# Patient Record
Sex: Female | Born: 1980 | Race: Black or African American | Hispanic: No | Marital: Single | Smoking: Former smoker
Health system: Southern US, Community
[De-identification: ages and names within clinical notes are randomized; demographics above are authoritative.]

## PROBLEM LIST (undated history)

## (undated) HISTORY — PX: CHOLECYSTECTOMY: SHX55

---

## 2002-08-03 ENCOUNTER — Emergency Department (HOSPITAL_COMMUNITY): Admission: EM | Admit: 2002-08-03 | Discharge: 2002-08-03 | Payer: Self-pay | Admitting: Emergency Medicine

## 2005-09-09 ENCOUNTER — Emergency Department (HOSPITAL_COMMUNITY): Admission: EM | Admit: 2005-09-09 | Discharge: 2005-09-10 | Payer: Self-pay | Admitting: Emergency Medicine

## 2005-10-04 ENCOUNTER — Ambulatory Visit: Payer: Self-pay | Admitting: Family Medicine

## 2005-11-14 ENCOUNTER — Encounter (INDEPENDENT_AMBULATORY_CARE_PROVIDER_SITE_OTHER): Payer: Self-pay | Admitting: *Deleted

## 2005-11-14 ENCOUNTER — Ambulatory Visit (HOSPITAL_COMMUNITY): Admission: RE | Admit: 2005-11-14 | Discharge: 2005-11-15 | Payer: Self-pay | Admitting: Surgery

## 2007-12-22 ENCOUNTER — Emergency Department (HOSPITAL_COMMUNITY): Admission: EM | Admit: 2007-12-22 | Discharge: 2007-12-22 | Payer: Self-pay | Admitting: Emergency Medicine

## 2007-12-23 ENCOUNTER — Emergency Department (HOSPITAL_COMMUNITY): Admission: EM | Admit: 2007-12-23 | Discharge: 2007-12-23 | Payer: Self-pay | Admitting: Emergency Medicine

## 2008-02-14 ENCOUNTER — Emergency Department (HOSPITAL_COMMUNITY): Admission: EM | Admit: 2008-02-14 | Discharge: 2008-02-14 | Payer: Self-pay | Admitting: Emergency Medicine

## 2008-03-09 ENCOUNTER — Ambulatory Visit (HOSPITAL_COMMUNITY): Admission: RE | Admit: 2008-03-09 | Discharge: 2008-03-09 | Payer: Self-pay | Admitting: Obstetrics & Gynecology

## 2008-04-04 ENCOUNTER — Ambulatory Visit (HOSPITAL_COMMUNITY): Admission: RE | Admit: 2008-04-04 | Discharge: 2008-04-04 | Payer: Self-pay | Admitting: Obstetrics & Gynecology

## 2008-04-06 ENCOUNTER — Ambulatory Visit: Payer: Self-pay | Admitting: Obstetrics & Gynecology

## 2008-04-06 ENCOUNTER — Inpatient Hospital Stay (HOSPITAL_COMMUNITY): Admission: AD | Admit: 2008-04-06 | Discharge: 2008-04-06 | Payer: Self-pay | Admitting: Family Medicine

## 2008-04-08 ENCOUNTER — Ambulatory Visit: Payer: Self-pay | Admitting: Physician Assistant

## 2008-04-08 ENCOUNTER — Inpatient Hospital Stay (HOSPITAL_COMMUNITY): Admission: AD | Admit: 2008-04-08 | Discharge: 2008-04-08 | Payer: Self-pay | Admitting: Obstetrics and Gynecology

## 2008-06-06 ENCOUNTER — Ambulatory Visit (HOSPITAL_COMMUNITY): Admission: RE | Admit: 2008-06-06 | Discharge: 2008-06-06 | Payer: Self-pay | Admitting: Family Medicine

## 2008-07-04 ENCOUNTER — Ambulatory Visit (HOSPITAL_COMMUNITY): Admission: RE | Admit: 2008-07-04 | Discharge: 2008-07-04 | Payer: Self-pay | Admitting: Obstetrics & Gynecology

## 2008-07-22 ENCOUNTER — Ambulatory Visit (HOSPITAL_COMMUNITY): Admission: RE | Admit: 2008-07-22 | Discharge: 2008-07-22 | Payer: Self-pay | Admitting: Obstetrics & Gynecology

## 2008-07-27 ENCOUNTER — Inpatient Hospital Stay (HOSPITAL_COMMUNITY): Admission: AD | Admit: 2008-07-27 | Discharge: 2008-07-28 | Payer: Self-pay | Admitting: Obstetrics & Gynecology

## 2008-07-29 ENCOUNTER — Inpatient Hospital Stay (HOSPITAL_COMMUNITY): Admission: AD | Admit: 2008-07-29 | Discharge: 2008-07-29 | Payer: Self-pay | Admitting: Obstetrics & Gynecology

## 2008-07-29 ENCOUNTER — Ambulatory Visit: Payer: Self-pay | Admitting: Obstetrics & Gynecology

## 2008-07-31 ENCOUNTER — Inpatient Hospital Stay (HOSPITAL_COMMUNITY): Admission: AD | Admit: 2008-07-31 | Discharge: 2008-08-02 | Payer: Self-pay | Admitting: Obstetrics & Gynecology

## 2008-07-31 ENCOUNTER — Ambulatory Visit: Payer: Self-pay | Admitting: Obstetrics and Gynecology

## 2008-07-31 ENCOUNTER — Encounter: Payer: Self-pay | Admitting: Obstetrics & Gynecology

## 2008-08-09 ENCOUNTER — Ambulatory Visit: Payer: Self-pay | Admitting: Family Medicine

## 2008-08-09 ENCOUNTER — Inpatient Hospital Stay (HOSPITAL_COMMUNITY): Admission: AD | Admit: 2008-08-09 | Discharge: 2008-08-09 | Payer: Self-pay | Admitting: Gynecology

## 2008-08-12 ENCOUNTER — Inpatient Hospital Stay (HOSPITAL_COMMUNITY): Admission: AD | Admit: 2008-08-12 | Discharge: 2008-08-12 | Payer: Self-pay | Admitting: Obstetrics & Gynecology

## 2010-01-18 ENCOUNTER — Emergency Department (HOSPITAL_COMMUNITY): Admission: EM | Admit: 2010-01-18 | Discharge: 2010-01-18 | Payer: Self-pay | Admitting: Emergency Medicine

## 2010-03-13 IMAGING — US US OB LIMITED
1 series · 13 of 13 positions shown · non-contrast
Comparison: 12/22/07.

CLINICAL DATA: Bleeding, cramping, pregnancy.  
 LIMITED OBSTETRICAL ULTRASOUND:

[Series 1: unknown · 0.28mm/px · 13 of 13 slices shown]
[im 1/13]
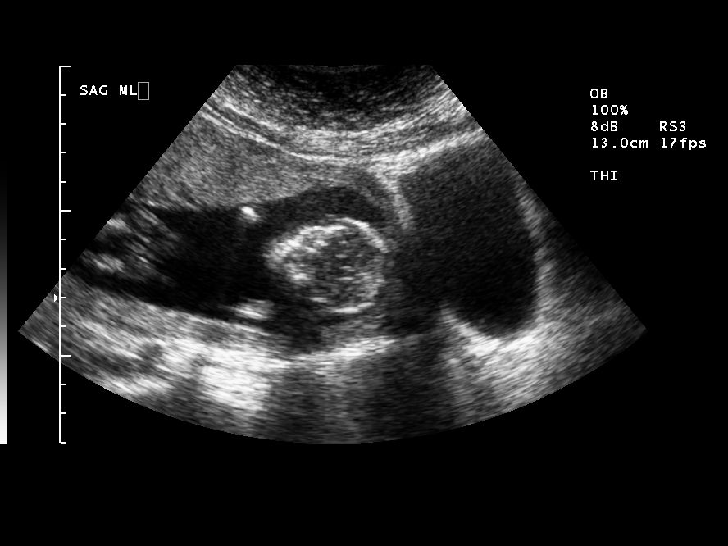
[im 2/13]
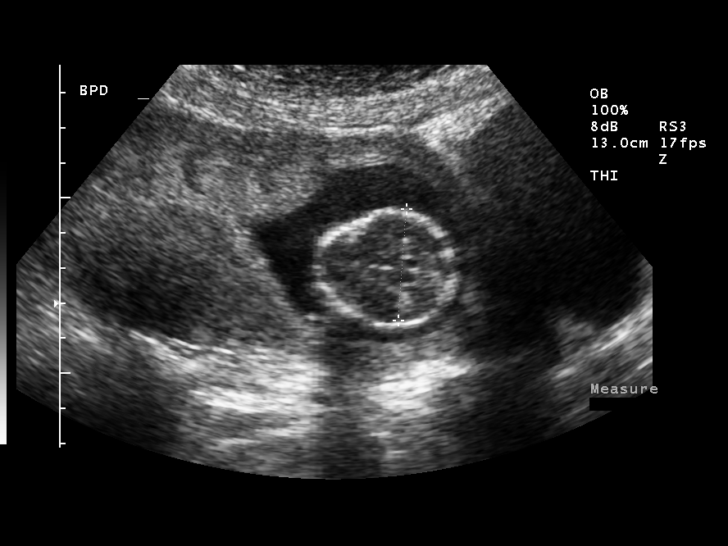
[im 3/13]
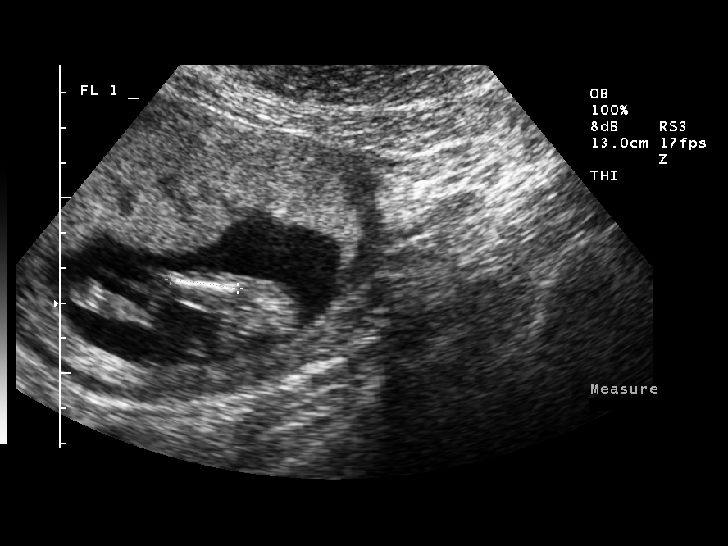
[im 4/13]
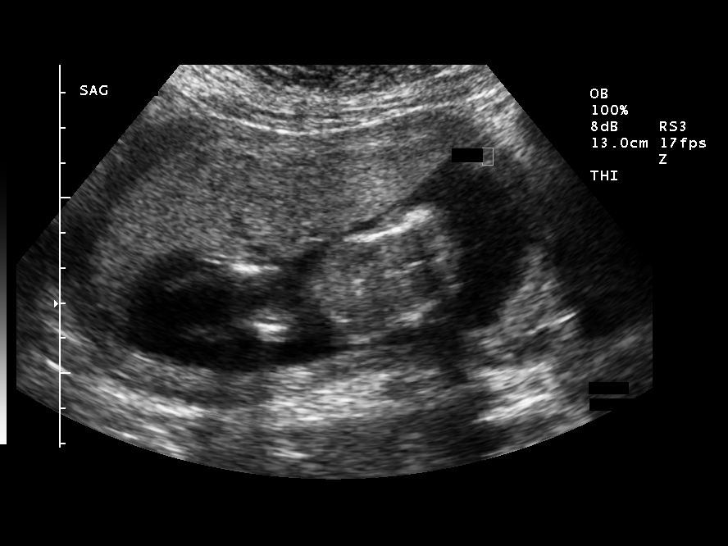
[im 5/13]
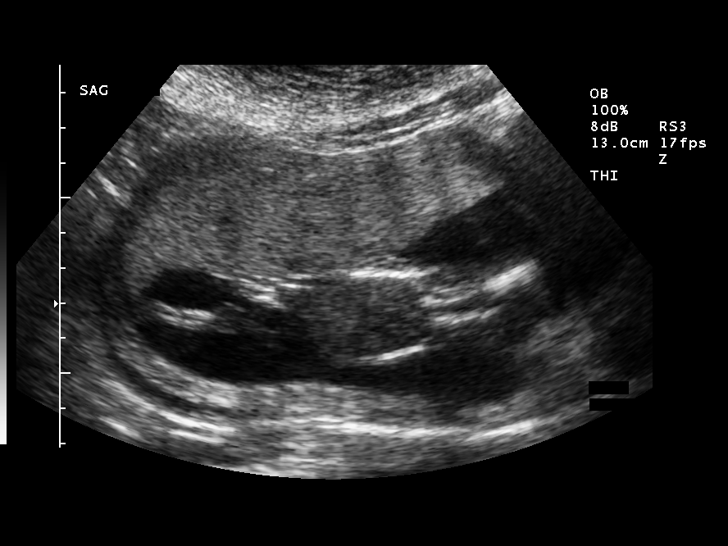
[im 6/13]
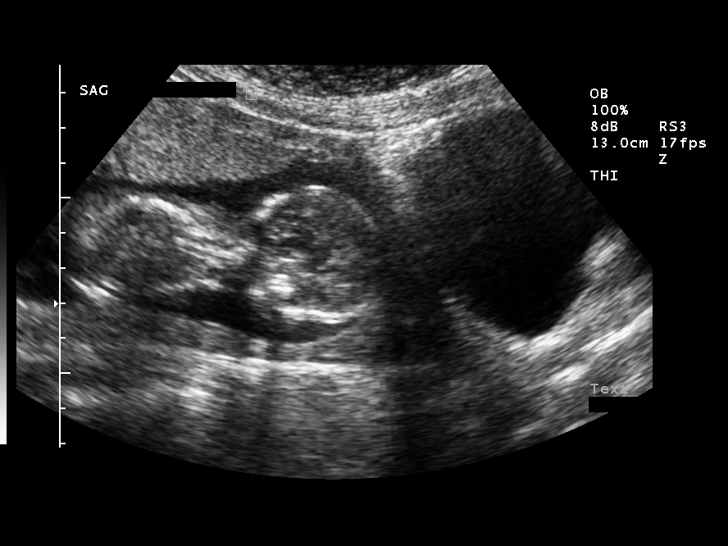
[im 7/13]
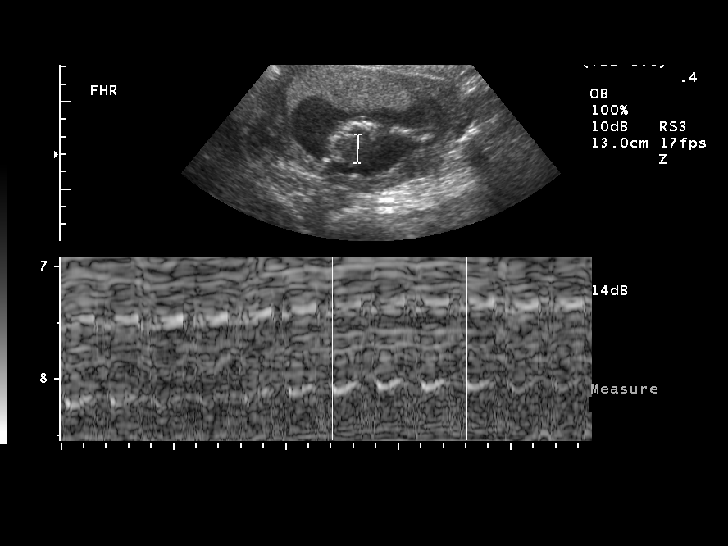
[im 8/13]
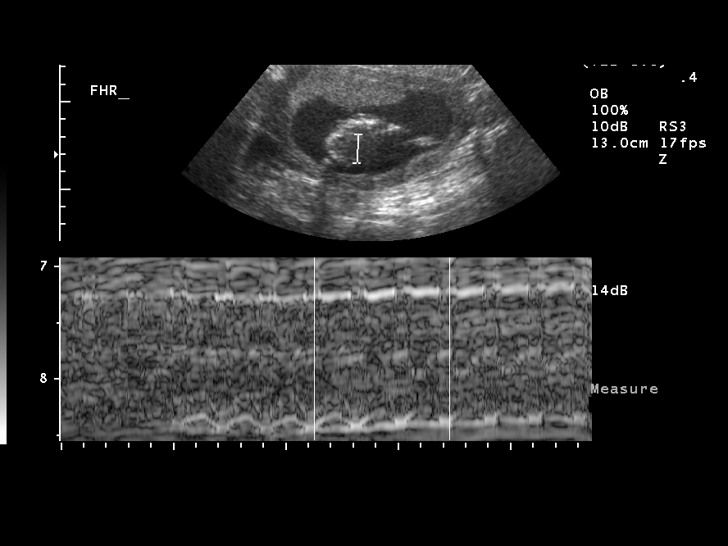
[im 9/13]
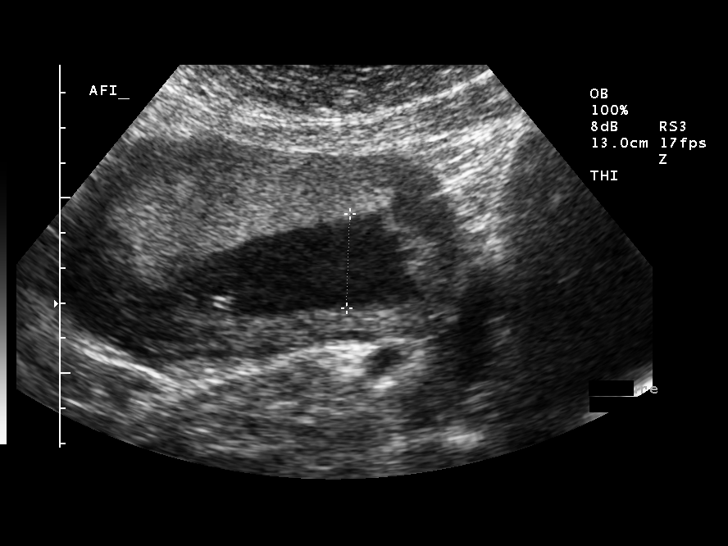
[im 10/13]
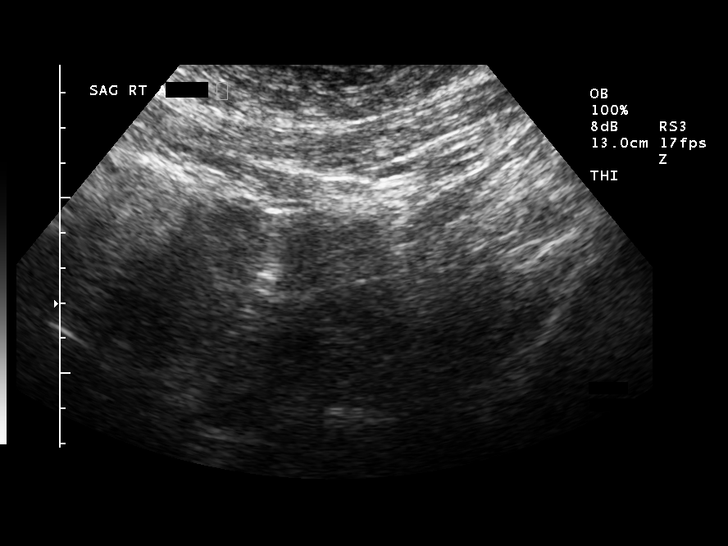
[im 11/13]
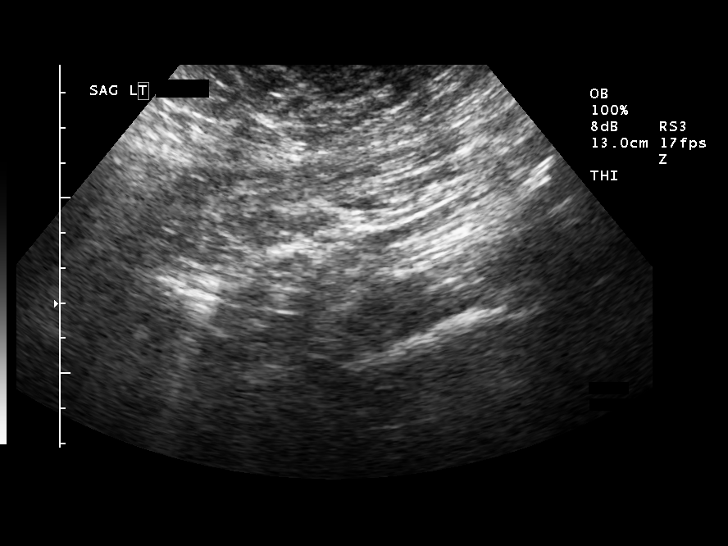
[im 12/13]
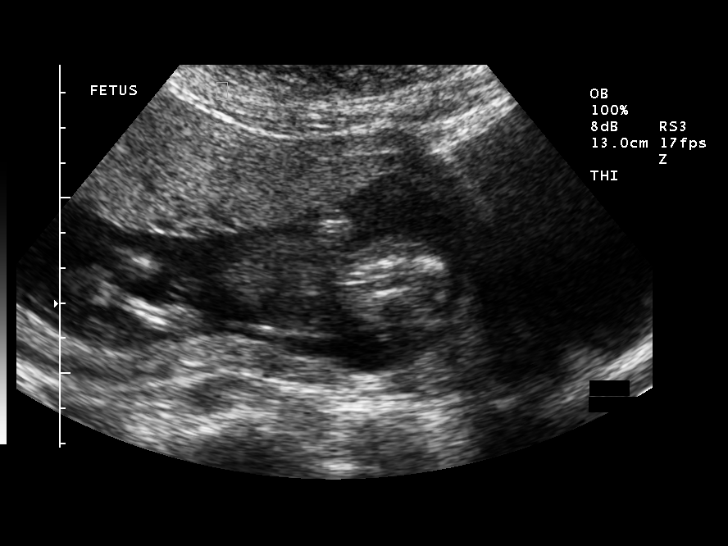
[im 13/13]
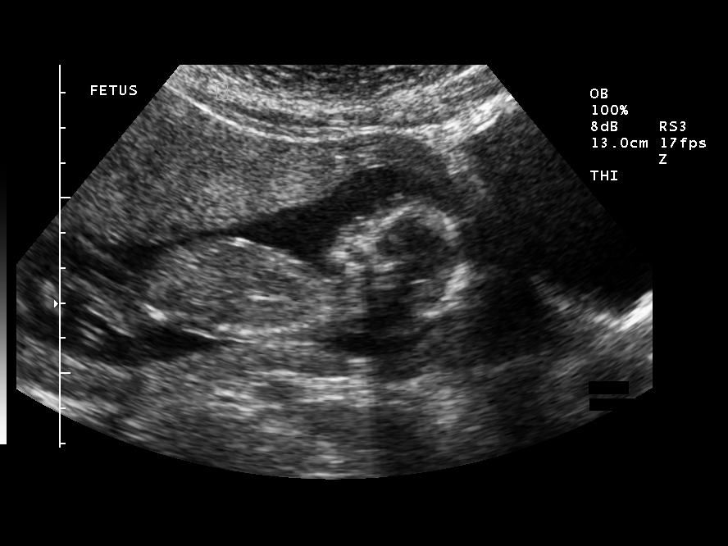

[13 of 13 positions shown; findings below may reference images not displayed]

FINDINGS: Single viable intrauterine pregnancy is identified.  Fetal heart rate is 150 bpm.  Fetal movement is present.  Presentation is cephalic.  Placental location is anterior.  No evidence of previa.  Amniotic fluid volume is normal.  Amniotic fluid index 13.2, with a 5th percentile measurement of 7.9 and a 95th percentile of 18.5.  Biparietal diameter is 3.2 cm, corresponding with 16 weeks 0 days.  Femur length is 1.9 cm, corresponding with 15 weeks 5 days.  Cervical os is closed.
IMPRESSION: Single viable intrauterine pregnancy with estimated gestational age of 16 weeks.

## 2010-09-04 ENCOUNTER — Emergency Department (HOSPITAL_COMMUNITY): Admission: EM | Admit: 2010-09-04 | Discharge: 2010-09-04 | Payer: Self-pay | Admitting: Emergency Medicine

## 2010-10-23 ENCOUNTER — Inpatient Hospital Stay (HOSPITAL_COMMUNITY)
Admission: AD | Admit: 2010-10-23 | Discharge: 2010-10-23 | Payer: Self-pay | Source: Home / Self Care | Admitting: Obstetrics & Gynecology

## 2010-12-05 ENCOUNTER — Ambulatory Visit (HOSPITAL_COMMUNITY)
Admission: RE | Admit: 2010-12-05 | Discharge: 2010-12-05 | Payer: Self-pay | Source: Home / Self Care | Attending: Family Medicine | Admitting: Family Medicine

## 2010-12-15 ENCOUNTER — Inpatient Hospital Stay (HOSPITAL_COMMUNITY)
Admission: AD | Admit: 2010-12-15 | Discharge: 2010-12-16 | Payer: Self-pay | Source: Home / Self Care | Attending: Obstetrics & Gynecology | Admitting: Obstetrics & Gynecology

## 2010-12-18 ENCOUNTER — Other Ambulatory Visit: Payer: Self-pay | Admitting: Family Medicine

## 2010-12-18 LAB — COMPREHENSIVE METABOLIC PANEL
ALT: 29 U/L (ref 0–35)
AST: 26 U/L (ref 0–37)
Albumin: 2.9 g/dL — ABNORMAL LOW (ref 3.5–5.2)
Alkaline Phosphatase: 119 U/L — ABNORMAL HIGH (ref 39–117)
BUN: 4 mg/dL — ABNORMAL LOW (ref 6–23)
CO2: 23 mEq/L (ref 19–32)
Calcium: 8.6 mg/dL (ref 8.4–10.5)
Chloride: 105 mEq/L (ref 96–112)
Creatinine, Ser: 0.5 mg/dL (ref 0.4–1.2)
GFR calc Af Amer: >60 mL/min (ref >60)
GFR calc non Af Amer: >60 mL/min (ref >60)
Glucose, Bld: 91 mg/dL (ref 70–99)
Potassium: 3.6 mEq/L (ref 3.5–5.1)
Sodium: 136 mEq/L (ref 135–145)
Total Bilirubin: 0.7 mg/dL (ref 0.3–1.2)
Total Protein: 6.8 g/dL (ref 6.0–8.3)

## 2010-12-18 LAB — GC/CHLAMYDIA PROBE AMP, GENITAL
Chlamydia, DNA Probe: NEGATIVE
GC Probe Amp, Genital: NEGATIVE

## 2010-12-18 LAB — INFLUENZA PANEL BY PCR (TYPE A & B)
H1N1 flu by pcr: NOT DETECTED
Influenza A By PCR: NEGATIVE
Influenza B By PCR: NEGATIVE

## 2010-12-18 LAB — DIFFERENTIAL
Basophils Absolute: 0 10*3/uL (ref 0.0–0.1)
Basophils Relative: 0 % (ref 0–1)
Eosinophils Absolute: 0 10*3/uL (ref 0.0–0.7)
Eosinophils Relative: 0 % (ref 0–5)
Lymphocytes Relative: 8 % — ABNORMAL LOW (ref 12–46)
Lymphs Abs: 1 10*3/uL (ref 0.7–4.0)
Monocytes Absolute: 0.8 10*3/uL (ref 0.1–1.0)
Monocytes Relative: 7 % (ref 3–12)
Neutro Abs: 10.5 10*3/uL — ABNORMAL HIGH (ref 1.7–7.7)
Neutrophils Relative %: 85 % — ABNORMAL HIGH (ref 43–77)

## 2010-12-18 LAB — CBC
HCT: 36.5 % (ref 36.0–46.0)
Hemoglobin: 12.4 g/dL (ref 12.0–15.0)
MCH: 25.6 pg — ABNORMAL LOW (ref 26.0–34.0)
MCHC: 34 g/dL (ref 30.0–36.0)
MCV: 75.4 fL — ABNORMAL LOW (ref 78.0–100.0)
Platelets: 141 10*3/uL — ABNORMAL LOW (ref 150–400)
RBC: 4.84 MIL/uL (ref 3.87–5.11)
RDW: 15.4 % (ref 11.5–15.5)
WBC: 12.3 10*3/uL — ABNORMAL HIGH (ref 4.0–10.5)

## 2010-12-18 LAB — FETAL FIBRONECTIN: Fetal Fibronectin: NEGATIVE

## 2010-12-18 LAB — WET PREP, GENITAL
Clue Cells Wet Prep HPF POC: NONE SEEN
Trich, Wet Prep: NONE SEEN
Yeast Wet Prep HPF POC: NONE SEEN

## 2011-01-01 ENCOUNTER — Ambulatory Visit (HOSPITAL_COMMUNITY)
Admission: RE | Admit: 2011-01-01 | Discharge: 2011-01-01 | Disposition: A | Discharge status: Home / Self Care | Payer: Medicaid Other | Source: Ambulatory Visit | Attending: Family Medicine | Admitting: Family Medicine

## 2011-01-01 ENCOUNTER — Encounter (HOSPITAL_COMMUNITY): Payer: Self-pay

## 2011-01-01 DIAGNOSIS — Z3689 Encounter for other specified antenatal screening: Secondary | ICD-10-CM | POA: Insufficient documentation

## 2011-01-01 DIAGNOSIS — O3660X0 Maternal care for excessive fetal growth, unspecified trimester, not applicable or unspecified: Secondary | ICD-10-CM | POA: Insufficient documentation

## 2011-01-24 ENCOUNTER — Inpatient Hospital Stay (HOSPITAL_COMMUNITY)
Admission: AD | Admit: 2011-01-24 | Discharge: 2011-01-26 | DRG: 775 | Disposition: A | Discharge status: Home / Self Care | Payer: Medicaid Other | Source: Ambulatory Visit | Attending: Obstetrics and Gynecology | Admitting: Obstetrics and Gynecology

## 2011-01-24 DIAGNOSIS — O411090 Infection of amniotic sac and membranes, unspecified, unspecified trimester, not applicable or unspecified: Secondary | ICD-10-CM | POA: Diagnosis present

## 2011-01-24 DIAGNOSIS — O99892 Other specified diseases and conditions complicating childbirth: Principal | ICD-10-CM | POA: Diagnosis present

## 2011-01-24 DIAGNOSIS — O9989 Other specified diseases and conditions complicating pregnancy, childbirth and the puerperium: Secondary | ICD-10-CM

## 2011-01-24 DIAGNOSIS — Z22330 Carrier of Group B streptococcus: Secondary | ICD-10-CM

## 2011-01-24 LAB — CBC
HCT: 35.4 % — ABNORMAL LOW (ref 36.0–46.0)
Hemoglobin: 11.7 g/dL — ABNORMAL LOW (ref 12.0–15.0)
MCH: 25.7 pg — ABNORMAL LOW (ref 26.0–34.0)
MCHC: 33.1 g/dL (ref 30.0–36.0)
MCV: 77.6 fL — ABNORMAL LOW (ref 78.0–100.0)
Platelets: 161 10*3/uL (ref 150–400)
RBC: 4.56 MIL/uL (ref 3.87–5.11)
RDW: 16.2 % — ABNORMAL HIGH (ref 11.5–15.5)
WBC: 12.3 10*3/uL — ABNORMAL HIGH (ref 4.0–10.5)

## 2011-01-24 LAB — RPR: RPR Ser Ql: NONREACTIVE

## 2011-01-28 LAB — RH IMMUNE GLOB WKUP(>/=20WKS)(NOT WOMEN'S HOSP)
Fetal Screen: NEGATIVE
Unit division: 0

## 2011-02-05 LAB — CBC
HCT: 33.7 % — ABNORMAL LOW (ref 36.0–46.0)
Hemoglobin: 11.2 g/dL — ABNORMAL LOW (ref 12.0–15.0)
MCH: 28.1 pg (ref 26.0–34.0)
MCHC: 33.2 g/dL (ref 30.0–36.0)
MCV: 84.7 fL (ref 78.0–100.0)
Platelets: 204 10*3/uL (ref 150–400)
RBC: 3.98 MIL/uL (ref 3.87–5.11)
RDW: 14.2 % (ref 11.5–15.5)
WBC: 12.7 10*3/uL — ABNORMAL HIGH (ref 4.0–10.5)

## 2011-02-05 LAB — URINALYSIS, ROUTINE W REFLEX MICROSCOPIC
Bilirubin Urine: NEGATIVE
Glucose, UA: 250 mg/dL — AB
Hgb urine dipstick: NEGATIVE
Ketones, ur: NEGATIVE mg/dL
Nitrite: NEGATIVE
Protein, ur: NEGATIVE mg/dL
Specific Gravity, Urine: 1.02 (ref 1.005–1.030)
Urobilinogen, UA: 0.2 mg/dL (ref 0.0–1.0)
pH: 6.5 (ref 5.0–8.0)

## 2011-02-05 LAB — DIFFERENTIAL
Basophils Absolute: 0 10*3/uL (ref 0.0–0.1)
Basophils Relative: 0 % (ref 0–1)
Eosinophils Absolute: 0 10*3/uL (ref 0.0–0.7)
Eosinophils Relative: 0 % (ref 0–5)
Monocytes Absolute: 0.9 10*3/uL (ref 0.1–1.0)
Neutro Abs: 9.2 10*3/uL — ABNORMAL HIGH (ref 1.7–7.7)

## 2011-02-05 LAB — RH IMMUNE GLOBULIN WORKUP (NOT WOMEN'S HOSP)
ABO/RH(D): B NEG
Antibody Screen: NEGATIVE
Unit division: 0

## 2011-02-07 LAB — URINALYSIS, ROUTINE W REFLEX MICROSCOPIC
Bilirubin Urine: NEGATIVE
Glucose, UA: 100 mg/dL — AB
Hgb urine dipstick: NEGATIVE
Protein, ur: NEGATIVE mg/dL
Urobilinogen, UA: 0.2 mg/dL (ref 0.0–1.0)

## 2011-04-12 NOTE — Op Note (Signed)
Toni Maldonado, Toni Maldonado                ACCOUNT NO.:  [id number]   MEDICAL RECORD NO.:  [id number]          PATIENT TYPE:  OIB   LOCATION:  5703                         FACILITY:  [facility]   PHYSICIAN:  Clovis Pu. Cornett, M.D.DATE OF BIRTH:  [birth year]   DATE OF PROCEDURE:  11/14/2005  DATE OF DISCHARGE:  11/15/2005                                 OPERATIVE REPORT   PREOPERATIVE DIAGNOSIS:  Symptomatic cholelithiasis.   POSTOPERATIVE DIAGNOSIS:  Symptomatic cholelithiasis.   PROCEDURE:  Laparoscopic cholecystectomy with intraoperative cholangiogram.   SURGEON:  Thomas A. Cornett, M.D.   ASSISTANT:  Sheppard Plumber. Earlene Plater, M.D.   ANESTHESIA:  General endotracheal esthesia with 10 mL of 0.5% Sensorcaine  local.   ESTIMATED BLOOD LOSS:  20 mL.   SPECIMEN:  Gallbladder to pathology.   INDICATIONS FOR PROCEDURE:  The patient is a 30 year old female with  progressive right upper quadrant pain. She was seen on an outpatient basis  and was scheduled for laparoscopic cholecystectomy.  Over the last week her  symptoms have become much worse and we have moved her surgery up for  symptomatic cholelithiasis. The procedure was explained to the patient as  well as the potential complications and outcome and she agreed to proceed  after giving informed consent.   DESCRIPTION OF PROCEDURE:  The patient was brought to the operating room and  placed supine. After induction of general endotracheal anesthesia, the  abdomen was prepped and draped in sterile fashion.  A 1 cm supraumbilical  incision was made and dissection was carried down to the fascia. The fascia  was grasped with a Kocher and a small incision was made in the fascia with a  scalpel. I then grasp both sides of the fascia and used a Kelly clamp to  push into the abdominal cavity bluntly. A pursestring suture of 0 Vicryl was  placed and a 12 mm Hassan cannula was placed under direct vision.  Pneumoperitoneum was created to 50 mmHg of CO2.  Laparoscopy was performed.  There was no evidence of any solid or hollow organ injury. Next, the patient  was placed in reverse Trendelenburg and rolled to her left.  General  inspection of the abdomen revealed no other significant abnormality. A 5 mm  port was then placed under direct vision. Two other 5 mL ports were placed  in the right mid abdomen. The gallbladder was identified, grasped by its  dome and retracted toward the patient's right shoulder. A second grasper was  used to grab the infundibulum and retract it toward the patient's right  lower quadrant. Cautery was used to score the peritoneum at the junction of  the cystic duct and cystic artery. Once this was done, we were able to  dissect out the cystic duct circumferentially as the only tubular structure  entering the gallbladder. Clip was placed on the gallbladder side of the  cystic duct and a small incision was made in the cystic duct. Through a  separate stab incision a Cook cholangiogram catheter was introduced and  placed in the cystic duct and held in place by a clip. Intraoperative  cholangiogram was performed using half-strength Hypaque dye. There was free  flow of contrast through the cystic duct up into the common hepatic duct to  the bifurcation of the right and left hepatic ducts. There was free flow of  contrast in the common bile duct into the duodenum. I did not see any  evidence of stone, obstruction or stricture at this point. The catheter was  then removed and the cholangiogram was complete. Three clips were placed in  the cystic duct. Cystic arteries identified coursing on the body of the  gallbladder and three clips were placed on this and this was divided.  After  this was done, the cautery was used to dissect the gallbladder from the  gallbladder fossa. All bleeding was controlled with electrocautery. Using a  5 mm scope an EndoCatch bag was placed and the gallbladder was placed in the  EndoCatch bag and  extracted through the umbilicus and passed off the field.  The gallbladder bed was examined and irrigation was used and suctioned out.  There was still some oozing from the gallbladder bed. I controlled this with  cautery. I did not see any further bleeding as this was done and there was  no evidence of any leakage of bile. Clips were on the cystic duct and cystic  arteries respectively. At this point, any excess irrigation was suctioned  out until clear. The ports were subsequently removed with no evidence of  port site bleeding. The camera was withdrawn from the umbilicus and there  was no evidence of any solid or hollow organ injury at this point. The  Hassan cannula was removed and passed off the field. CO2 was released. I  then close the umbilical port fascia with the previously placed pursestring  suture. The 4-0 Monocryl was used to close all skin incisions. All sponge,  instruments and needles were counted and found to be correct at this portion  of the case. The patient was then awoke and taken to recovery in  satisfactory condition.      Thomas A. Cornett, M.D.  Electronically Signed     TAC/MEDQ  D:  11/14/2005  T:  11/16/2005  Job:  [id number]   cc:   [facility]

## 2011-08-15 LAB — CBC
Hemoglobin: 12.3
MCHC: 33.5
RBC: 4.53
WBC: 12.4 — ABNORMAL HIGH

## 2011-08-15 LAB — DIFFERENTIAL
Basophils Relative: 0
Lymphocytes Relative: 21
Lymphs Abs: 2.6
Monocytes Absolute: 0.5
Monocytes Relative: 4
Neutro Abs: 9.3 — ABNORMAL HIGH
Neutrophils Relative %: 75

## 2011-08-15 LAB — URINALYSIS, ROUTINE W REFLEX MICROSCOPIC
Glucose, UA: NEGATIVE
Hgb urine dipstick: NEGATIVE
Specific Gravity, Urine: 1.016
Urobilinogen, UA: 0.2

## 2011-08-15 LAB — BASIC METABOLIC PANEL
CO2: 28
Calcium: 8.8
Creatinine, Ser: 0.65
GFR calc Af Amer: >60
Sodium: 137

## 2011-08-19 LAB — DIFFERENTIAL
Basophils Relative: 0
Eosinophils Absolute: 0
Eosinophils Relative: 0
Lymphs Abs: 2.2
Monocytes Absolute: 0.8
Monocytes Relative: 8
Neutrophils Relative %: 71

## 2011-08-19 LAB — CBC
HCT: 36.6
Hemoglobin: 12.2
MCHC: 33.4
MCV: 81.3
RBC: 4.5
WBC: 10.8 — ABNORMAL HIGH

## 2011-08-19 LAB — URINALYSIS, ROUTINE W REFLEX MICROSCOPIC
Bilirubin Urine: NEGATIVE
Glucose, UA: 100 — AB
Ketones, ur: NEGATIVE
Nitrite: NEGATIVE
Specific Gravity, Urine: 1.017
pH: 7

## 2011-08-19 LAB — RH IMMUNE GLOBULIN WORKUP (NOT WOMEN'S HOSP): Antibody Screen: NEGATIVE

## 2011-08-19 LAB — RPR: RPR Ser Ql: NONREACTIVE

## 2011-08-19 LAB — POCT PREGNANCY, URINE: Operator id: 29026

## 2011-08-19 LAB — WET PREP, GENITAL
Trich, Wet Prep: NONE SEEN
Yeast Wet Prep HPF POC: NONE SEEN

## 2011-08-19 LAB — ABO/RH: ABO/RH(D): B NEG

## 2011-08-21 LAB — WOUND CULTURE

## 2011-08-26 LAB — COMPREHENSIVE METABOLIC PANEL
ALT: 52 — ABNORMAL HIGH
Alkaline Phosphatase: 86
BUN: 10
CO2: 27
Chloride: 109
GFR calc non Af Amer: >60
Glucose, Bld: 98
Potassium: 3.4 — ABNORMAL LOW
Sodium: 142
Total Bilirubin: 0.6

## 2011-08-26 LAB — URIC ACID: Uric Acid, Serum: 7.2 — ABNORMAL HIGH

## 2011-08-26 LAB — CBC
HCT: 40
Hemoglobin: 12.9
RBC: 4.78
RDW: 15.9 — ABNORMAL HIGH
WBC: 8.6

## 2011-08-28 LAB — RH IMMUNE GLOB WKUP(>/=20WKS)(NOT WOMEN'S HOSP): Fetal Screen: NEGATIVE

## 2011-08-28 LAB — CBC
HCT: 39.3
Hemoglobin: 11.8 — ABNORMAL LOW
MCHC: 32.7
Platelets: 150
RBC: 4.36
RDW: 16.5 — ABNORMAL HIGH
RDW: 17.1 — ABNORMAL HIGH
WBC: 9.9

## 2011-08-28 LAB — RPR: RPR Ser Ql: NONREACTIVE

## 2013-01-01 IMAGING — US US OB FOLLOW-UP
1 series · 12 of 28 positions shown · non-contrast
Comparison: none

[Series 1: us ob follow up · 12 of 86 slices shown]
[im 4/86]
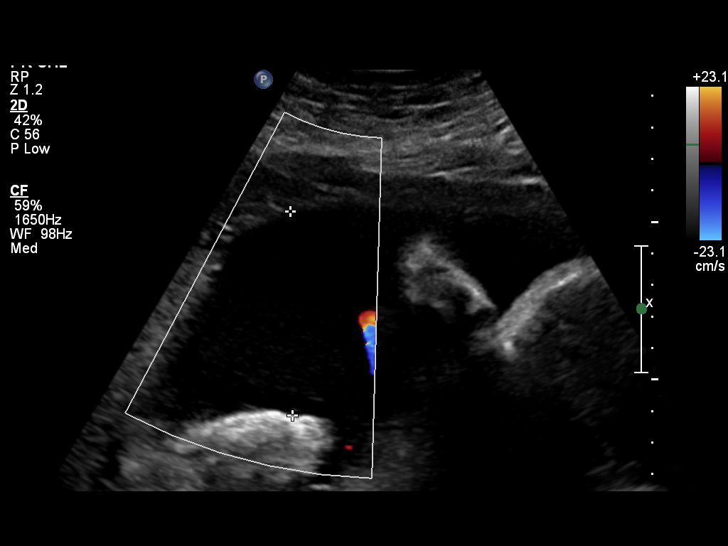
[im 10/86]
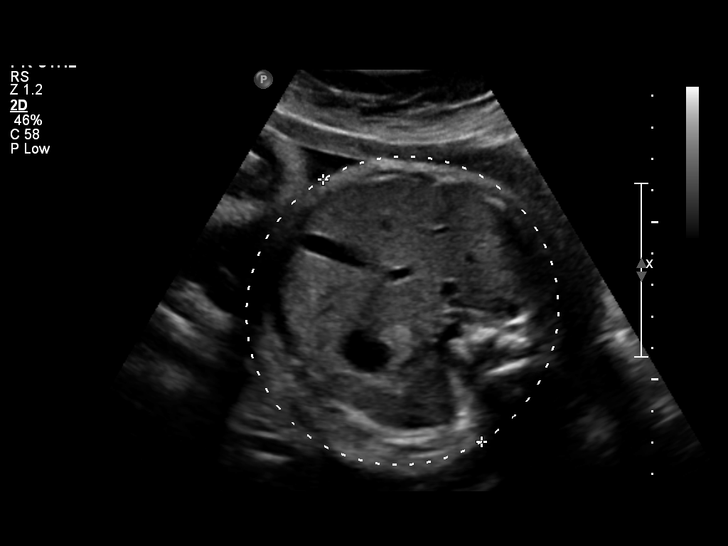
[im 16/86]
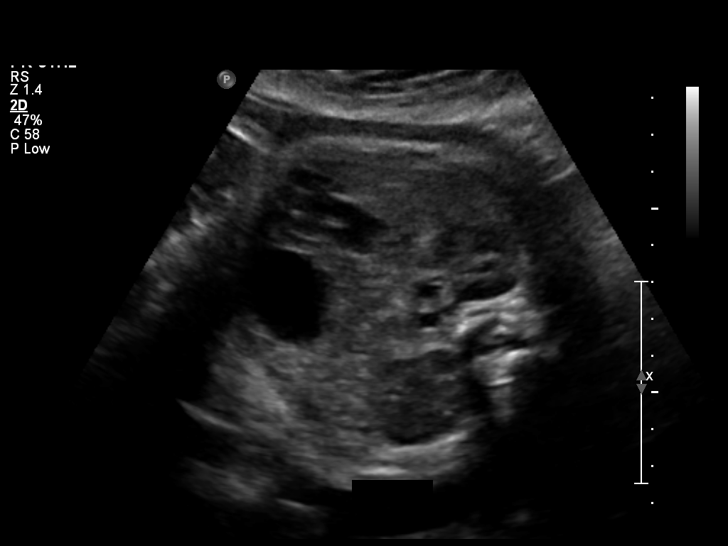
[im 26/86]
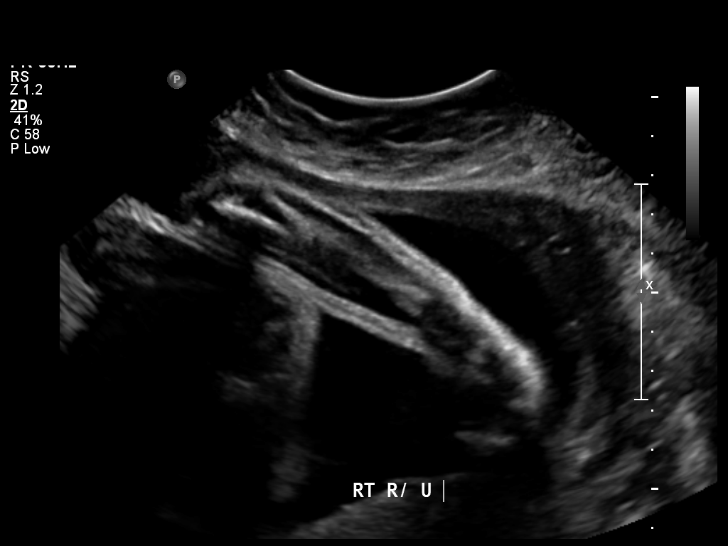
[im 32/86]
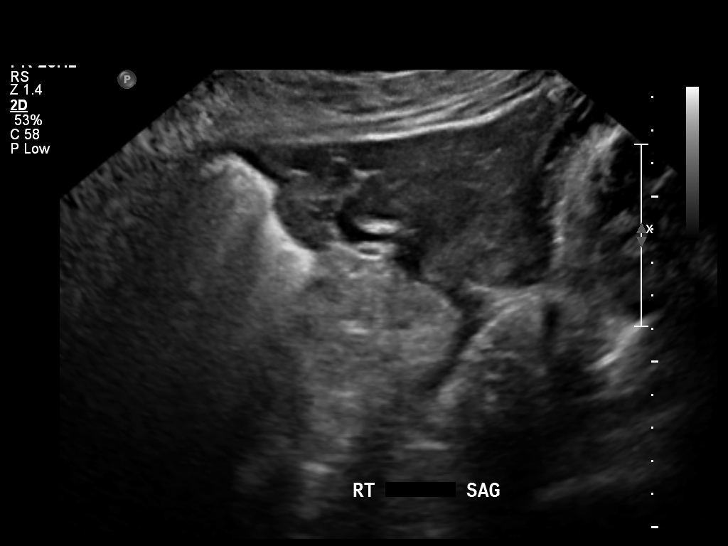
[im 38/86]
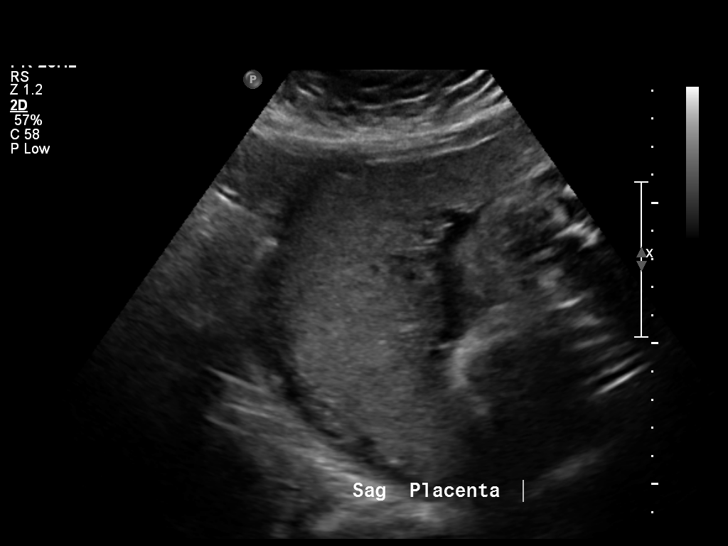
[im 48/86]
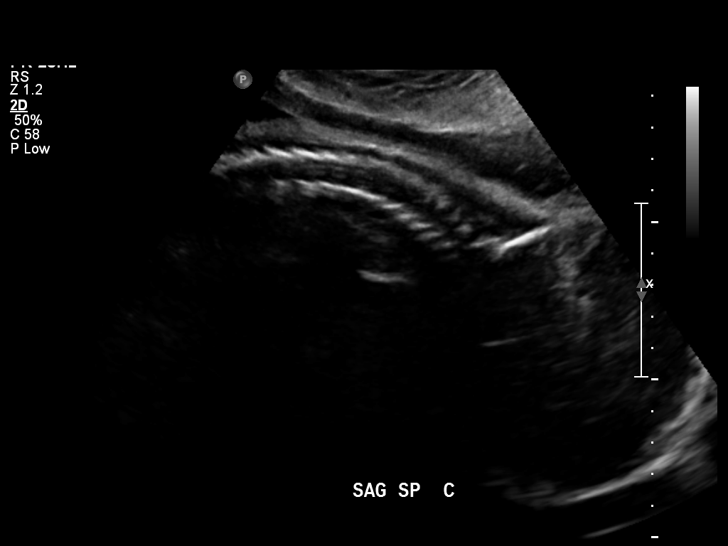
[im 54/86]
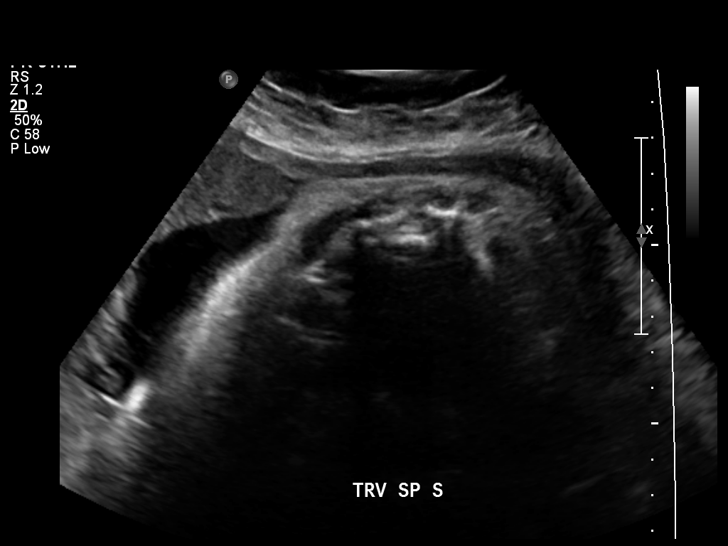
[im 60/86]
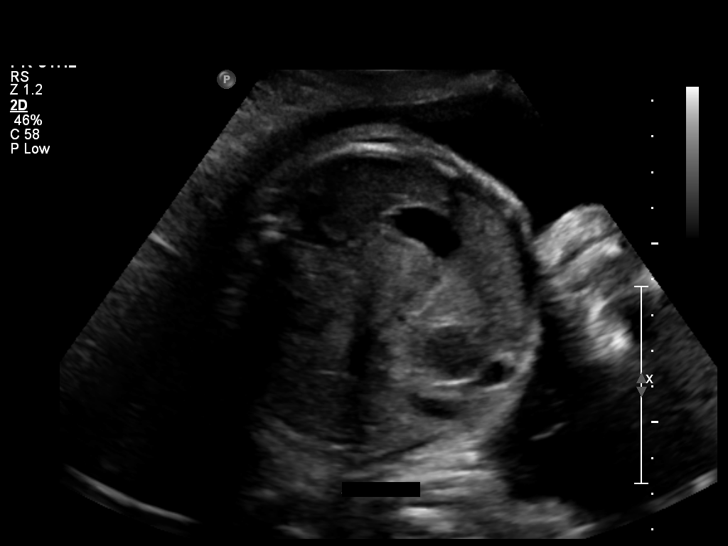
[im 70/86]
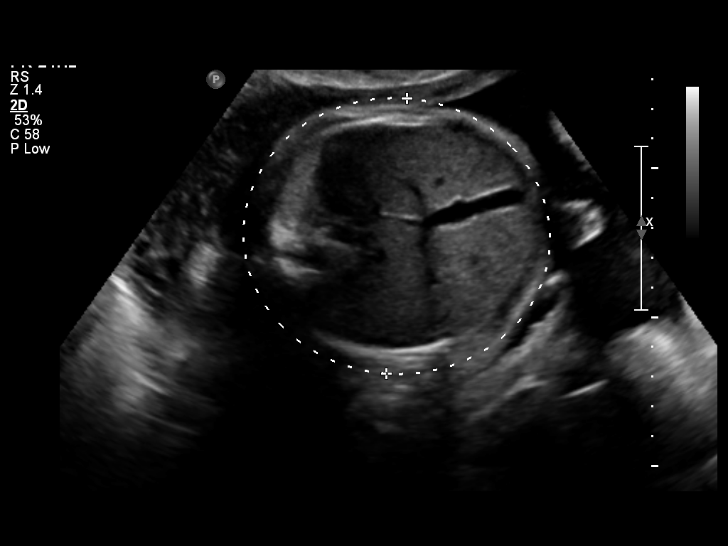
[im 76/86]
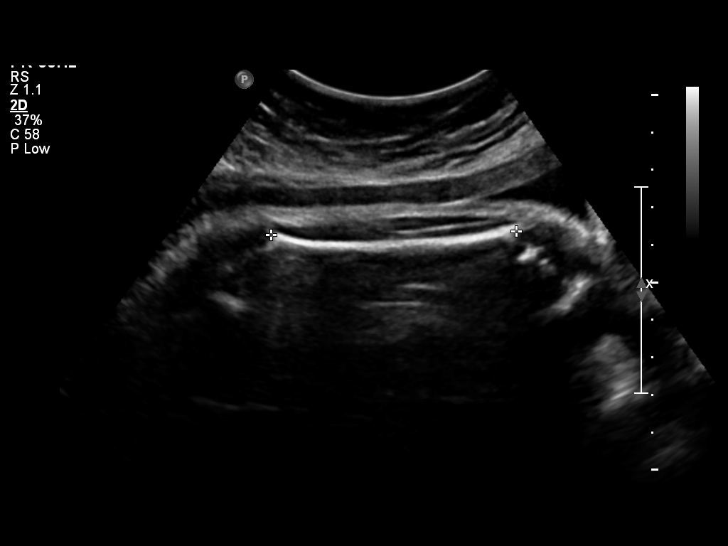
[im 82/86]
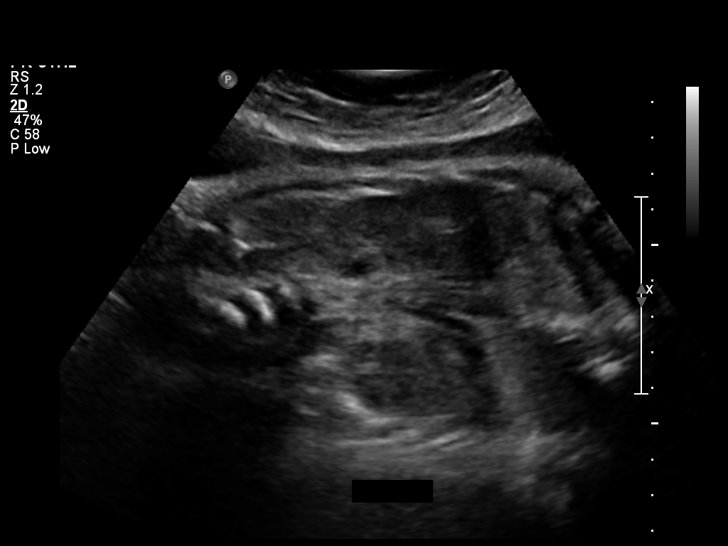

[12 of 28 positions shown; findings below may reference images not displayed]

OBSTETRICS REPORT
                    (Corrected Final 12/06/2010 [DATE])

                 [REDACTED]-
                 Faculty Physician
 Order#:         _O
Procedures

 US OB FOLLOW UP                                       76816.1
Indications

 No or Little Prenatal Care
 Size greater than dates (Large for gestational [AGE]
 Assess Fetal Growth / Estimated Fetal Weight
 Follow-up fetal anatomic evaluation
Fetal Evaluation

 Fetal Heart Rate:  143                          bpm
 Cardiac Activity:  Observed
 Presentation:      Cephalic
 Placenta:          Fundal, above cervical os
 P. Cord            Previously Visualized
 Insertion:

 Amniotic Fluid
 AFI FV:      Subjectively within normal limits
 AFI Sum:     15.15   cm       54  %Tile     Larg Pckt:    6.78  cm
 RUQ:   6.78    cm   RLQ:    4.56   cm    LUQ:   0.96    cm   LLQ:    2.85   cm
Biometry

 BPD:     86.4  mm     G. Age:  34w 6d                CI:        70.08   70 - 86
                                                      FL/HC:      20.4   19.1 -

 HC:     329.2  mm     G. Age:  37w 3d     > 97  %    HC/AC:      1.08   0.96 -

 AC:     304.2  mm     G. Age:  34w 3d       93  %    FL/BPD:     77.8   71 - 87
 FL:      67.2  mm     G. Age:  34w 4d       88  %    FL/AC:      22.1   20 - 24

 Est. FW:    6226  gm      5 lb 9 oz     88  %
Gestational Age

 LMP:           32w 3d        Date:  04/22/10                 EDD:   01/27/11
 U/S Today:     35w 2d                                        EDD:   01/07/11
 Best:          32w 3d     Det. By:  LMP  (04/22/10)          EDD:   01/27/11
Anatomy

 Cranium:           Appears normal      Aortic Arch:       Basic anatomy
                                                           exam per order
 Fetal Cavum:       Previously seen     Ductal Arch:       Basic anatomy
                                                           exam per order
 Ventricles:        Appears normal      Diaphragm:         Appears normal
 Choroid Plexus:    Appears normal      Stomach:           Appears
                                                           normal, left
                                                           sided
 Cerebellum:        Previously seen     Abdomen:           Appears normal
 Posterior Fossa:   Previously seen     Abdominal Wall:    Not well
                                                           visualized
 Nuchal Fold:       Not applicable      Cord Vessels:      Previously seen
                    (>20 wks GA)
 Face:              Lips and orbits     Kidneys:           Appear normal
                    previously seen
 Heart:             Appears normal      Bladder:           Appears normal
                    (4 chamber &
                    axis)
 RVOT:              Basic anatomy       Spine:             Appears normal
                    exam per order
 LVOT:              Basic anatomy       Limbs:             Four extremities
                    exam per order                         previously seen

 Other:     Fetus appears to be a female. RUE appears normal.
            Profile appears normal.
Cervix Uterus Adnexa

 Cervical Length:    3.1      cm

 Cervix:       Normal appearance by transabdominal scan.

 Left Ovary:    Not visualized.
 Right Ovary:   Within normal limits.
 Adnexa:     No abnormality visualized.
Impression

 Assigned GA is currently 33w 6d. EFW is currently at the
 88%. Continued close follow up for growth would be
 recommended to exclude a developing LGA fetus.
 Amniotic fluid within normal limits, with AFI of 15.15 cm.
 No late developing fetal abnormalities seen involving
 visualized anatomy.

 questions or concerns.

## 2013-10-12 ENCOUNTER — Encounter (HOSPITAL_COMMUNITY): Payer: Self-pay | Admitting: Emergency Medicine

## 2013-10-12 ENCOUNTER — Emergency Department (HOSPITAL_COMMUNITY): Payer: Medicaid Other

## 2013-10-12 ENCOUNTER — Emergency Department (HOSPITAL_COMMUNITY)
Admission: EM | Admit: 2013-10-12 | Discharge: 2013-10-12 | Disposition: A | Discharge status: Home / Self Care | Payer: Medicaid Other | Attending: Emergency Medicine | Admitting: Emergency Medicine

## 2013-10-12 DIAGNOSIS — J159 Unspecified bacterial pneumonia: Secondary | ICD-10-CM | POA: Insufficient documentation

## 2013-10-12 DIAGNOSIS — R509 Fever, unspecified: Secondary | ICD-10-CM | POA: Insufficient documentation

## 2013-10-12 DIAGNOSIS — R079 Chest pain, unspecified: Secondary | ICD-10-CM | POA: Insufficient documentation

## 2013-10-12 DIAGNOSIS — R109 Unspecified abdominal pain: Secondary | ICD-10-CM | POA: Insufficient documentation

## 2013-10-12 DIAGNOSIS — F17200 Nicotine dependence, unspecified, uncomplicated: Secondary | ICD-10-CM | POA: Insufficient documentation

## 2013-10-12 DIAGNOSIS — Z3202 Encounter for pregnancy test, result negative: Secondary | ICD-10-CM | POA: Insufficient documentation

## 2013-10-12 DIAGNOSIS — J189 Pneumonia, unspecified organism: Secondary | ICD-10-CM

## 2013-10-12 LAB — LIPASE, BLOOD: Lipase: 15 U/L (ref 11–59)

## 2013-10-12 LAB — CBC WITH DIFFERENTIAL/PLATELET
Basophils Absolute: 0 10*3/uL (ref 0.0–0.1)
Basophils Relative: 0 % (ref 0–1)
Eosinophils Absolute: 0 10*3/uL (ref 0.0–0.7)
HCT: 41 % (ref 36.0–46.0)
Hemoglobin: 14.1 g/dL (ref 12.0–15.0)
MCH: 27.7 pg (ref 26.0–34.0)
MCHC: 34.4 g/dL (ref 30.0–36.0)
Monocytes Absolute: 0.5 10*3/uL (ref 0.1–1.0)
Monocytes Relative: 4 % (ref 3–12)
RDW: 13.9 % (ref 11.5–15.5)

## 2013-10-12 LAB — URINALYSIS, ROUTINE W REFLEX MICROSCOPIC
Ketones, ur: 15 mg/dL — AB
Nitrite: NEGATIVE
Specific Gravity, Urine: 1.026 (ref 1.005–1.030)
pH: 7.5 (ref 5.0–8.0)

## 2013-10-12 LAB — COMPREHENSIVE METABOLIC PANEL
AST: 167 U/L — ABNORMAL HIGH (ref 0–37)
Albumin: 3.5 g/dL (ref 3.5–5.2)
BUN: 8 mg/dL (ref 6–23)
Calcium: 8.8 mg/dL (ref 8.4–10.5)
Creatinine, Ser: 0.83 mg/dL (ref 0.50–1.10)
Total Protein: 7.6 g/dL (ref 6.0–8.3)

## 2013-10-12 LAB — POCT PREGNANCY, URINE: Preg Test, Ur: NEGATIVE

## 2013-10-12 LAB — POCT I-STAT TROPONIN I: Troponin i, poc: 0 ng/mL (ref 0.00–0.08)

## 2013-10-12 LAB — URINE MICROSCOPIC-ADD ON

## 2013-10-12 MED ORDER — ACETAMINOPHEN 325 MG PO TABS
650.0000 mg | ORAL_TABLET | Freq: Once | ORAL | Status: AC
  Administered 2013-10-12: 650 mg via ORAL
  Filled 2013-10-12: qty 2

## 2013-10-12 MED ORDER — SODIUM CHLORIDE 0.9 % IV BOLUS (SEPSIS)
1000.0000 mL | Freq: Once | INTRAVENOUS | Status: AC
  Administered 2013-10-12: 1000 mL via INTRAVENOUS

## 2013-10-12 MED ORDER — AZITHROMYCIN 250 MG PO TABS: 250.0000 mg | ORAL_TABLET | Freq: Every day | ORAL | Status: DC

## 2013-10-12 MED ORDER — AZITHROMYCIN 250 MG PO TABS
500.0000 mg | ORAL_TABLET | Freq: Every day | ORAL | Status: DC
  Administered 2013-10-12: 500 mg via ORAL
  Filled 2013-10-12: qty 2

## 2013-10-12 MED ORDER — ONDANSETRON HCL 4 MG/2ML IJ SOLN
INTRAMUSCULAR | Status: AC
  Filled 2013-10-12: qty 2

## 2013-10-12 MED ORDER — ONDANSETRON HCL 4 MG/2ML IJ SOLN
4.0000 mg | Freq: Once | INTRAMUSCULAR | Status: AC
  Administered 2013-10-12: 4 mg via INTRAVENOUS

## 2013-10-12 MED ORDER — SODIUM CHLORIDE 0.9 % IV SOLN: INTRAVENOUS | Status: DC

## 2013-10-12 MED ORDER — POTASSIUM CHLORIDE CRYS ER 20 MEQ PO TBCR
40.0000 meq | EXTENDED_RELEASE_TABLET | Freq: Once | ORAL | Status: AC
  Administered 2013-10-12: 40 meq via ORAL
  Filled 2013-10-12: qty 2

## 2013-10-12 MED ORDER — DEXTROSE 5 % IV SOLN
1.0000 g | INTRAVENOUS | Status: DC
  Administered 2013-10-12: 1 g via INTRAVENOUS
  Filled 2013-10-12: qty 10

## 2013-10-12 NOTE — ED Notes (Signed)
Pt verbalizes she has had a HA, Fever, productive cough, and intermittent L Sided chest pain that is non- radiating and sharp in nature x 2 days

## 2013-10-12 NOTE — ED Notes (Signed)
MD at bedside. 

## 2013-10-12 NOTE — ED Notes (Signed)
Patient transported to X-ray 

## 2013-10-12 NOTE — ED Notes (Signed)
Pt return from XR.

## 2013-10-12 NOTE — ED Provider Notes (Signed)
CSN: [id number]     Arrival date & time 10/12/13  1432 History   First MD Initiated Contact with Patient 10/12/13 1529     Chief Complaint  Patient presents with  . Fever  . Cough  . Chest Pain  . Abdominal Pain   (Consider location/radiation/quality/duration/timing/severity/associated sxs/prior Treatment) Patient is a 32 y.o. female presenting with fever, cough, chest pain, and abdominal pain. The history is provided by the patient.  Fever Associated symptoms: chest pain and cough   Cough Associated symptoms: chest pain and fever   Chest Pain Associated symptoms: abdominal pain, cough and fever   Abdominal Pain Associated symptoms: chest pain, cough and fever    patient here complaining of cough x2 weeks has been productive of yellow sputum. Does have posttussive emesis. Denies any headache or neck pain. No photophobia noted. Denies any dysuria or hematuria. Has had intermittent sharp left-sided chest pain lasting seconds. Denies any swelling in her legs. No treatment used for this prior to arrival. Nothing makes her symptoms better worse. Denies any popping or dyspnea on exertion.  History reviewed. No pertinent past medical history. No past surgical history on file. No family history on file. History  Substance Use Topics  . Smoking status: Current Some Day Smoker  . Smokeless tobacco: Not on file  . Alcohol Use: Not on file   OB History   Grav Para Term Preterm Abortions TAB SAB Ect Mult Living   1              Review of Systems  Constitutional: Positive for fever.  Respiratory: Positive for cough.   Cardiovascular: Positive for chest pain.  Gastrointestinal: Positive for abdominal pain.  All other systems reviewed and are negative.    Allergies  Review of patient's allergies indicates no known allergies.  Home Medications  No current outpatient prescriptions on file. BP 145/83  Pulse 124  Temp(Src) 100 F (37.8 C)  Resp 20  SpO2 99% Physical Exam   Nursing note and vitals reviewed. Constitutional: She is oriented to person, place, and time. She appears well-developed and well-nourished.  Non-toxic appearance. No distress.  HENT:  Head: Normocephalic and atraumatic.  Eyes: Conjunctivae, EOM and lids are normal. Pupils are equal, round, and reactive to light.  Neck: Normal range of motion. Neck supple. No tracheal deviation present. No mass present.  Cardiovascular: Normal rate, regular rhythm and normal heart sounds.  Exam reveals no gallop.   No murmur heard. Pulmonary/Chest: Effort normal. No stridor. No respiratory distress. She has decreased breath sounds. She has no wheezes. She has no rhonchi. She has no rales.  Abdominal: Soft. Normal appearance and bowel sounds are normal. She exhibits no distension. There is no tenderness. There is no rebound and no CVA tenderness.  Musculoskeletal: Normal range of motion. She exhibits no edema and no tenderness.  Neurological: She is alert and oriented to person, place, and time. She has normal strength. No cranial nerve deficit or sensory deficit. GCS eye subscore is 4. GCS verbal subscore is 5. GCS motor subscore is 6.  Skin: Skin is warm and dry. No abrasion and no rash noted.  Psychiatric: She has a normal mood and affect. Her speech is normal and behavior is normal.    ED Course  Procedures (including critical care time) Labs Review Labs Reviewed  CBC WITH DIFFERENTIAL - Abnormal; Notable for the following:    WBC 12.2 (*)    Neutrophils Relative % 89 (*)    Neutro Abs 10.8 (*)  Lymphocytes Relative 7 (*)    All other components within normal limits  COMPREHENSIVE METABOLIC PANEL  LIPASE, BLOOD  URINALYSIS, ROUTINE W REFLEX MICROSCOPIC  POCT PREGNANCY, URINE  POCT I-STAT TROPONIN I   Imaging Review No results found.  EKG Interpretation    Date/Time:  Tuesday October 12 2013 14:37:22 EST Ventricular Rate:  127 PR Interval:  130 QRS Duration: 76 QT Interval:  304 QTC  Calculation: 441 R Axis:   21 Text Interpretation:  Sinus tachycardia Cannot rule out Anterior infarct , age undetermined Abnormal ECG Confirmed by Freida Busman  MD, Deara Bober ([id number]) on 10/12/2013 3:41:39 PM            MDM  No diagnosis found. Patient's chest x-ray with likely early pneumonia. She was given Zithromax and Rocephin here. Also given IV fluids and had some nausea which was treated with Zofran. She'll be placed on antibiotics and discharged home    Toy Baker, MD 10/12/13 [phone]

## 2013-10-12 NOTE — ED Notes (Signed)
Pts family member reports pt vomiting. Dr. Freida Busman notified.

## 2013-10-12 NOTE — ED Notes (Addendum)
Fever yesterday , cough x 2 weeks , cp for a while x 1 month n/v also all over abd pain since yesterday

## 2014-02-01 ENCOUNTER — Encounter (HOSPITAL_COMMUNITY): Payer: Self-pay | Admitting: Emergency Medicine

## 2014-02-01 ENCOUNTER — Emergency Department (HOSPITAL_COMMUNITY): Payer: Medicaid Other

## 2014-02-01 ENCOUNTER — Emergency Department (HOSPITAL_COMMUNITY)
Admission: EM | Admit: 2014-02-01 | Discharge: 2014-02-01 | Disposition: A | Discharge status: Home / Self Care | Payer: Medicaid Other | Attending: Emergency Medicine | Admitting: Emergency Medicine

## 2014-02-01 DIAGNOSIS — IMO0002 Reserved for concepts with insufficient information to code with codable children: Secondary | ICD-10-CM | POA: Insufficient documentation

## 2014-02-01 DIAGNOSIS — J40 Bronchitis, not specified as acute or chronic: Secondary | ICD-10-CM | POA: Insufficient documentation

## 2014-02-01 DIAGNOSIS — R071 Chest pain on breathing: Secondary | ICD-10-CM

## 2014-02-01 DIAGNOSIS — F17200 Nicotine dependence, unspecified, uncomplicated: Secondary | ICD-10-CM | POA: Insufficient documentation

## 2014-02-01 DIAGNOSIS — Z79899 Other long term (current) drug therapy: Secondary | ICD-10-CM | POA: Insufficient documentation

## 2014-02-01 DIAGNOSIS — R0789 Other chest pain: Secondary | ICD-10-CM

## 2014-02-01 DIAGNOSIS — IMO0001 Reserved for inherently not codable concepts without codable children: Secondary | ICD-10-CM | POA: Insufficient documentation

## 2014-02-01 MED ORDER — PREDNISONE 20 MG PO TABS: 40.0000 mg | ORAL_TABLET | Freq: Every day | ORAL | Status: DC

## 2014-02-01 MED ORDER — HYDROCODONE-HOMATROPINE 5-1.5 MG/5ML PO SYRP: 5.0000 mL | ORAL_SOLUTION | Freq: Four times a day (QID) | ORAL | Status: DC | PRN

## 2014-02-01 MED ORDER — IBUPROFEN 600 MG PO TABS: 600.0000 mg | ORAL_TABLET | Freq: Four times a day (QID) | ORAL | Status: DC | PRN

## 2014-02-01 NOTE — ED Notes (Signed)
Pt in c/o cough and congestion since yesterday, body aches, unsure of fever, increased pain and congestion today

## 2014-02-01 NOTE — ED Notes (Signed)
Pt. Reports cold like symptoms but states "I think it might be pneumonia".

## 2014-02-01 NOTE — ED Provider Notes (Signed)
CSN: [id number]     Arrival date & time 02/01/14  0023 History   First MD Initiated Contact with Patient 02/01/14 0046     Chief Complaint  Patient presents with  . Cough     (Consider location/radiation/quality/duration/timing/severity/associated sxs/prior Treatment) HPI Comments: Patient is a 33 year old female who presents to the emergency department for chest pains. Patient states that chest pains began yesterday but worsened this morning. She states that symptoms have been associated with nasal congestion, sore throat, body aches, and a dry nonproductive cough. Patient denies fever, but states she has also been experiencing some cold chills. Patient has been taking over-the-counter Mucinex for symptoms without relief. She states that her chest pain is aching in nature and intermittently shooting from her left mid anterior chest through to her back. She states that the last time she had pain similar to this she was diagnosed with pneumonia. Patient denies associated syncope or near syncope, inability to swallow or drooling, shortness of breath, hemoptysis, vomiting or nausea, numbness/tingling, and weakness. Patient denies any personal history of ACS, coronary artery disease, diabetes mellitus, and DVT/PE. She denies any history of surgeries or hospitalizations in the last 3 months. Patient further denies a family history of ACS.  Patient is a 33 y.o. female presenting with cough. The history is provided by the patient. No language interpreter was used.  Cough Associated symptoms: chest pain, chills, myalgias and sore throat   Associated symptoms: no fever, no rhinorrhea and no shortness of breath     History reviewed. No pertinent past medical history. History reviewed. No pertinent past surgical history. History reviewed. No pertinent family history. History  Substance Use Topics  . Smoking status: Current Some Day Smoker  . Smokeless tobacco: Not on file  . Alcohol Use: Not on file    OB History   Grav Para Term Preterm Abortions TAB SAB Ect Mult Living   1              Review of Systems  Constitutional: Positive for chills and fatigue. Negative for fever.  HENT: Positive for congestion and sore throat. Negative for drooling, rhinorrhea and trouble swallowing.   Respiratory: Positive for cough. Negative for shortness of breath.   Cardiovascular: Positive for chest pain.  Gastrointestinal: Negative for nausea and vomiting.  Musculoskeletal: Positive for myalgias.  Neurological: Negative for syncope, weakness and numbness.  All other systems reviewed and are negative.     Allergies  Review of patient's allergies indicates no known allergies.  Home Medications   Current Outpatient Rx  Name  Route  Sig  Dispense  Refill  . aspirin-acetaminophen-caffeine (EXCEDRIN MIGRAINE) 250-250-65 MG per tablet   Oral   Take 2 tablets by mouth every 6 (six) hours as needed for headache.         . Aspirin-Acetaminophen-Caffeine (GOODY HEADACHE PO)   Oral   Take 1 packet by mouth daily as needed. For headache         . azithromycin (ZITHROMAX) 250 MG tablet   Oral   Take 1 tablet (250 mg total) by mouth daily. 1 every day until finished.   4 tablet   0   . HYDROcodone-homatropine (HYCODAN) 5-1.5 MG/5ML syrup   Oral   Take 5 mLs by mouth every 6 (six) hours as needed for cough.   120 mL   0   . ibuprofen (ADVIL,MOTRIN) 200 MG tablet   Oral   Take 200 mg by mouth every 6 (six) hours as needed for  headache.         . ibuprofen (ADVIL,MOTRIN) 600 MG tablet   Oral   Take 1 tablet (600 mg total) by mouth every 6 (six) hours as needed.   30 tablet   0   . predniSONE (DELTASONE) 20 MG tablet   Oral   Take 2 tablets (40 mg total) by mouth daily.   10 tablet   0    BP 143/83  Pulse 98  Temp(Src) 99.3 F (37.4 C) (Oral)  Resp 20  Wt 210 lb (95.255 kg)  SpO2 100%  LMP 02/01/2014  Physical Exam  Nursing note and vitals reviewed. Constitutional:  She is oriented to person, place, and time. She appears well-developed and well-nourished. No distress.  HENT:  Head: Normocephalic and atraumatic.  Mouth/Throat: Oropharynx is clear and moist. No oropharyngeal exudate.  Eyes: Conjunctivae and EOM are normal. Pupils are equal, round, and reactive to light. No scleral icterus.  Neck: Normal range of motion. Neck supple.  Cardiovascular: Normal rate, regular rhythm and normal heart sounds.   Pulmonary/Chest: Effort normal and breath sounds normal. No respiratory distress. She has no wheezes. She has no rales.  No retractions or accessory muscle use noted. Chest expansion is symmetric.  Musculoskeletal: Normal range of motion.  Neurological: She is alert and oriented to person, place, and time.  GCS 15. Speech is goal oriented. Patient moves extremities without ataxia.  Skin: Skin is warm and dry. No rash noted. She is not diaphoretic. No erythema. No pallor.  Psychiatric: She has a normal mood and affect. Her behavior is normal.    ED Course  Procedures (including critical care time) Labs Review Labs Reviewed - No data to display  Imaging Review Dg Chest 2 View  02/01/2014   CLINICAL DATA:  Cough, congestion, chest pain (Left side).  EXAM: CHEST  2 VIEW  COMPARISON:  10/12/2013  FINDINGS: Hypoaeration with elevated hemidiaphragms. Cardiomediastinal contours within normal range. No confluent airspace opacity, pleural effusion, or pneumothorax. Bone island along the anterior right fourth rib versus a calcified granuloma or vessel on end. Mild multilevel degenerative changes. Right upper quadrant surgical clips.  IMPRESSION: Hypoaeration.  No focal consolidation.   Electronically Signed   By: Jearld Lesch M.D.   On: 02/01/2014 01:28     Date: 02/01/2014  Rate: 72  Rhythm: normal sinus rhythm  QRS Axis: normal  Intervals: normal  ST/T Wave abnormalities: normal  Conduction Disutrbances:none  Narrative Interpretation: NSR; no STEMI or  ischemic change  Old EKG Reviewed: none available I have personally reviewed and interpreted this EKG.   MDM   Final diagnoses:  Bronchitis  Costochondral pain   Patient presenting for CP in setting of nasal congestion, cough, body aches, and chills. Symptom onset yesterday. Patient well and nontoxic appearing, hemodynamically stable, and afebrile. She has no hx of ACS or risk factors. EKG with NSR today and CXR without focal consolidation, PNA, or other acute cardiopulmonary changes. Symptoms today most c/w bronchitis and costochondral CP from coughing. Doubt ACS as patient with TIMI score of 0. She has no risk factors for ACS or FHx of ACS. Also doubt PE as patient without tachycardia, tachypnea, dyspnea, or hypoxia today. She is PERC negative.  Patient stable and appropriate for d/c with prednisone course x 5 days, Hycodan for cough, and ibuprofen for chest discomfort. PCP f/u advised. Return precautions discussed and patient agreeable to plan with no unaddressed concerns.   Filed Vitals:   02/01/14 0026 02/01/14 0222  BP:  143/83 124/85  Pulse: 98 71  Temp: 99.3 F (37.4 C) 99.2 F (37.3 C)  TempSrc: Oral Oral  Resp: 20   Weight: 210 lb (95.255 kg)   SpO2: 100% 100%       Antony MaduraKelly Azlyn Wingler, PA-C 02/04/14 1710

## 2014-02-01 NOTE — Discharge Instructions (Signed)
Recommend ibuprofen for pain and prednisone for symptoms. You may use Hycodan for cough as needed. Follow up with your primary doctor. Return if symptoms worsen.  Bronchitis Bronchitis is inflammation of the airways that extend from the windpipe into the lungs (bronchi). The inflammation often causes mucus to develop, which leads to a cough. If the inflammation becomes severe, it may cause shortness of breath. CAUSES  Bronchitis may be caused by:   Viral infections.   Bacteria.   Cigarette smoke.   Allergens, pollutants, and other irritants.  SIGNS AND SYMPTOMS  The most common symptom of bronchitis is a frequent cough that produces mucus. Other symptoms include:  Fever.   Body aches.   Chest congestion.   Chills.   Shortness of breath.   Sore throat.  DIAGNOSIS  Bronchitis is usually diagnosed through a medical history and physical exam. Tests, such as chest X-rays, are sometimes done to rule out other conditions.  TREATMENT  You may need to avoid contact with whatever caused the problem (smoking, for example). Medicines are sometimes needed. These may include:  Antibiotics. These may be prescribed if the condition is caused by bacteria.  Cough suppressants. These may be prescribed for relief of cough symptoms.   Inhaled medicines. These may be prescribed to help open your airways and make it easier for you to breathe.   Steroid medicines. These may be prescribed for those with recurrent (chronic) bronchitis. HOME CARE INSTRUCTIONS  Get plenty of rest.   Drink enough fluids to keep your urine clear or pale yellow (unless you have a medical condition that requires fluid restriction). Increasing fluids may help thin your secretions and will prevent dehydration.   Only take over-the-counter or prescription medicines as directed by your health care provider.  Only take antibiotics as directed. Make sure you finish them even if you start to feel  better.  Avoid secondhand smoke, irritating chemicals, and strong fumes. These will make bronchitis worse. If you are a smoker, quit smoking. Consider using nicotine gum or skin patches to help control withdrawal symptoms. Quitting smoking will help your lungs heal faster.   Put a cool-mist humidifier in your bedroom at night to moisten the air. This may help loosen mucus. Change the water in the humidifier daily. You can also run the hot water in your shower and sit in the bathroom with the door closed for 5 10 minutes.   Follow up with your health care provider as directed.   Wash your hands frequently to avoid catching bronchitis again or spreading an infection to others.  SEEK MEDICAL CARE IF: Your symptoms do not improve after 1 week of treatment.  SEEK IMMEDIATE MEDICAL CARE IF:  Your fever increases.  You have chills.   You have chest pain.   You have worsening shortness of breath.   You have bloody sputum.  You faint.  You have lightheadedness.  You have a severe headache.   You vomit repeatedly. MAKE SURE YOU:   Understand these instructions.  Will watch your condition.  Will get help right away if you are not doing well or get worse. Document Released: 11/11/2005 Document Revised: 09/01/2013 Document Reviewed: 07/06/2013 [facility] Patient Information 2014 [place], [place]. Costochondritis Costochondritis is a condition in which the tissue (cartilage) that connects your ribs with your breastbone (sternum) becomes irritated. It causes pain in the chest and rib area. It usually goes away on its own over time. HOME CARE  Avoid activities that wear you out.  Do not  strain your ribs. Avoid activities that use your:  Chest.  Belly.  Side muscles.  Put ice on the area for the first 2 days after the pain starts.  Put ice in a plastic bag.  Place a towel between your skin and the bag.  Leave the ice on for 20 minutes, 2 3 times a day.  Only take  medicine as told by your doctor. GET HELP IF:  You have redness or puffiness (swelling) in the rib area.  Your pain does not go away with rest or medicine. GET HELP RIGHT AWAY IF:   Your pain gets worse.  You are very uncomfortable.  You have trouble breathing.  You cough up blood.  You start sweating or throwing up (vomiting).  You have a fever or lasting symptoms for more than 2 3 days.  You have a fever and your symptoms suddenly get worse. MAKE SURE YOU:   Understand these instructions.  Will watch your condition.  Will get help right away if you are not doing well or get worse. Document Released: 04/29/2008 Document Revised: 07/14/2013 Document Reviewed: 06/15/2013 [facility] Patient Information 2014 [place].

## 2014-02-04 NOTE — ED Provider Notes (Signed)
Medical screening examination/treatment/procedure(s) were performed by non-physician practitioner and as supervising physician I was immediately available for consultation/collaboration.   EKG Interpretation   Date/Time:  Tuesday February 01 2014 01:44:52 EDT Ventricular Rate:  72 PR Interval:  128 QRS Duration: 82 QT Interval:  398 QTC Calculation: 435 R Axis:   52 Text Interpretation:  Normal sinus rhythm Normal ECG ED PHYSICIAN  INTERPRETATION AVAILABLE IN [facility] Confirmed by TEST, Record  ([id number]) on 02/03/2014 7:02:39 AM        Loren Raceravid Docia Klar, MD 02/04/14 [phone]

## 2014-05-29 ENCOUNTER — Emergency Department (HOSPITAL_COMMUNITY)
Admission: EM | Admit: 2014-05-29 | Discharge: 2014-05-29 | Disposition: A | Discharge status: Home / Self Care | Payer: Medicaid Other | Attending: Emergency Medicine | Admitting: Emergency Medicine

## 2014-05-29 ENCOUNTER — Emergency Department (HOSPITAL_COMMUNITY): Payer: Medicaid Other

## 2014-05-29 ENCOUNTER — Encounter (HOSPITAL_COMMUNITY): Payer: Self-pay | Admitting: Emergency Medicine

## 2014-05-29 DIAGNOSIS — Y939 Activity, unspecified: Secondary | ICD-10-CM | POA: Diagnosis not present

## 2014-05-29 DIAGNOSIS — S99929A Unspecified injury of unspecified foot, initial encounter: Principal | ICD-10-CM

## 2014-05-29 DIAGNOSIS — S8990XA Unspecified injury of unspecified lower leg, initial encounter: Secondary | ICD-10-CM | POA: Diagnosis not present

## 2014-05-29 DIAGNOSIS — M25562 Pain in left knee: Secondary | ICD-10-CM

## 2014-05-29 DIAGNOSIS — S99919A Unspecified injury of unspecified ankle, initial encounter: Principal | ICD-10-CM

## 2014-05-29 DIAGNOSIS — Y9289 Other specified places as the place of occurrence of the external cause: Secondary | ICD-10-CM | POA: Insufficient documentation

## 2014-05-29 DIAGNOSIS — Z87891 Personal history of nicotine dependence: Secondary | ICD-10-CM | POA: Diagnosis not present

## 2014-05-29 DIAGNOSIS — Z792 Long term (current) use of antibiotics: Secondary | ICD-10-CM | POA: Diagnosis not present

## 2014-05-29 DIAGNOSIS — IMO0002 Reserved for concepts with insufficient information to code with codable children: Secondary | ICD-10-CM | POA: Insufficient documentation

## 2014-05-29 DIAGNOSIS — W1809XA Striking against other object with subsequent fall, initial encounter: Secondary | ICD-10-CM | POA: Insufficient documentation

## 2014-05-29 MED ORDER — NAPROXEN 500 MG PO TABS: 500.0000 mg | ORAL_TABLET | Freq: Two times a day (BID) | ORAL | Status: DC

## 2014-05-29 NOTE — ED Notes (Signed)
Pt reports that she fell onto her left knee 2 weeks and has had pain ever since. Pt ambulatory in triage. Pt has not taken anything for pain today.

## 2014-05-29 NOTE — ED Provider Notes (Signed)
CSN: [id number]     Arrival date & time 05/29/14  1218 History   This chart was scribed for non-physician practitioner  Santiago GladHeather Cindie Rajagopalan, PA-C, working with Gwyneth SproutWhitney Plunkett, MD, by Yevette EdwardsAngela Bracken, ED Scribe. This patient was seen in room TR08C/TR08C and the patient's care was started at 3:10 PM.  First MD Initiated Contact with Patient 05/29/14 1303     Chief Complaint  Patient presents with  . Knee Pain    Left knee    The history is provided by the patient. No language interpreter was used.   HPI Comments: Toni BullocksSheri M Maldonado is a 33 y.o. female who presents to the Emergency Department complaining of left knee pain which began after she fell and hit her knee against a toilet two weeks ago. She states swelling associated with the knee, which has improved.   She also states the knee feels unstable. She also endorses intermittent numbness and paresthesia to her left leg. [name] initially treated the knee with ice, but she has not used any OTC medication to address her symptoms. She reports prior injury to the knee as a child. The pt is ambulatory.   History reviewed. No pertinent past medical history. Past Surgical History  Procedure Laterality Date  . Cholecystectomy     No family history on file. History  Substance Use Topics  . Smoking status: Former Games developermoker  . Smokeless tobacco: Not on file  . Alcohol Use: Yes   OB History   Grav Para Term Preterm Abortions TAB SAB Ect Mult Living   1              Review of Systems  Musculoskeletal: Positive for arthralgias.  Neurological: Positive for numbness.  All other systems reviewed and are negative.   Allergies  Review of patient's allergies indicates no known allergies.  Home Medications   Prior to Admission medications   Medication Sig Start Date End Date Taking? Authorizing Provider  aspirin-acetaminophen-caffeine (EXCEDRIN MIGRAINE) [phone] MG per tablet Take 2 tablets by mouth every 6 (six) hours as needed for headache.     Historical Provider, MD  Aspirin-Acetaminophen-Caffeine (GOODY HEADACHE PO) Take 1 packet by mouth daily as needed. For headache    Historical Provider, MD  azithromycin (ZITHROMAX) 250 MG tablet Take 1 tablet (250 mg total) by mouth daily. 1 every day until finished. 10/12/13   Toy BakerAnthony T Allen, MD  HYDROcodone-homatropine [facility](HYCODAN) 5-1.5 MG/5ML syrup Take 5 mLs by mouth every 6 (six) hours as needed for cough. 02/01/14   Antony MaduraKelly Humes, PA-C  ibuprofen (ADVIL,MOTRIN) 200 MG tablet Take 200 mg by mouth every 6 (six) hours as needed for headache.    Historical Provider, MD  ibuprofen (ADVIL,MOTRIN) 600 MG tablet Take 1 tablet (600 mg total) by mouth every 6 (six) hours as needed. 02/01/14   Antony MaduraKelly Humes, PA-C  predniSONE (DELTASONE) 20 MG tablet Take 2 tablets (40 mg total) by mouth daily. 02/01/14   Antony MaduraKelly Humes, PA-C   Triage Vitals: BP 138/71  Pulse 78  Temp(Src) 98 F (36.7 C) (Oral)  Resp 18  Ht 5\' 2"  (1.575 m)  Wt 220 lb (99.791 kg)  BMI 40.23 kg/m2  SpO2 100%  LMP 05/14/2014  Breastfeeding? No  Physical Exam  Nursing note and vitals reviewed. Constitutional: She is oriented to person, place, and time. She appears well-developed and well-nourished. No distress.  HENT:  Head: Normocephalic and atraumatic.  Eyes: Conjunctivae and EOM are normal.  Neck: Neck supple. No tracheal deviation present.  Cardiovascular:  Normal rate, regular rhythm and normal heart sounds.  Exam reveals no friction rub.   No murmur heard. Pulses:      Dorsalis pedis pulses are 2+ on the right side, and 2+ on the left side.  Pulmonary/Chest: Effort normal and breath sounds normal. No respiratory distress. She has no wheezes. She has no rales.  Musculoskeletal: Normal range of motion.  Tender to palp over left tibial tuberosity. No obvious erythema, edema, or warmth.  No obvious laxity with varus or valgus stress, posterior or anterior drawer.   Neurological: She is alert and oriented to person, place, and time.   Distal sensation of left toes is intact.   Skin: Skin is warm and dry.  Psychiatric: She has a normal mood and affect. Her behavior is normal.    ED Course  Procedures (including critical care time)  DIAGNOSTIC STUDIES: Oxygen Saturation is 100% on room air, normal by my interpretation.    COORDINATION OF CARE:  3:14 PM- Discussed treatment plan with patient, and the patient agreed to the plan. The plan includes NSAIDs. Advised pt to continue to use ice.   Labs Review Labs Reviewed - No data to display  Imaging Review Dg Knee Complete 4 Views Left  05/29/2014   CLINICAL DATA:  Left knee pain.  No reported injury.  EXAM: LEFT KNEE - COMPLETE 4+ VIEW  COMPARISON:  None.  FINDINGS: Mild medial and lateral spur formation. Anterior patellar spurs. No effusion.  IMPRESSION: Mild degenerative changes.   Electronically Signed   By: Gordan PaymentSteve  Reid M.D.   On: 05/29/2014 14:59     EKG Interpretation None      MDM   Final diagnoses:  None   Patient presenting with left knee pain after hitting it on a toilet two weeks ago.  Xray negative.  No obvious laxity of the knee.  No signs of infection of the knee.  Patient neurovascularly intact.  Patient given knee sleeve.  Feel that the patient is stable for discharge.  Return precautions given.   Santiago GladHeather Jenniferann Stuckert, PA-C 05/30/14 2218

## 2014-05-29 NOTE — ED Notes (Signed)
Declined W/C at D/C and was escorted to lobby by RN. 

## 2014-05-29 NOTE — Discharge Instructions (Signed)
Be sure to read and understand instructions below prior to leaving the hospital. If your symptoms persist without any improvement in 1 week it is recommended that you follow up with orthopedics listed above.    TREATMENT  Rest, ice, elevation, and compression are the basic modes of treatment.   Apply ice to the sore area for 15 to 20 minutes, 3 to 4 times per day. Do this while you are awake for the first 2 days, or as directed. This can be stopped when the swelling goes away. Put the ice in a plastic bag and place a towel between the bag of ice and your skin.  Keep your leg elevated when possible to lessen swelling.  If your caregiver recommends crutches, use them as instructed for 1 week. Then, you may walk as tolerated.  Do not drive a vehicle on pain medication. ACTIVITY:            - Weight bearing as tolerated            - Exercises should be limited to pain free range of motion  Knee Immobilization:: This is used to support and protect an injured or painful knee. Knee immobilizers keep your knee from being used while it is healing.  Use powder to control irritation from sweat and friction.  Adjust the immobilizer to be firm but not tight. Signs of an immobilizer that is too tight include:   Swelling.   Numbness.   Color change in your foot or ankle.   Increased pain.  While resting, raise your leg above the level of your heart. This reduces throbbing and helps healing. Prop it up with pillows.  Remove the immobilizer to bathe and sleep. Wear it other times until you see your doctor again.               SEEK MEDICAL CARE IF:  You have an increase in bruising, swelling, or pain.  Your toes feel cold.  Pain relief is not achieved with medications.  EMERGENCY:: Your toes are numb or blue or you have severe pain.  You notice redness, swelling, warmth or increasing pain in your knee.  An unexplained oral temperature above 102 F (38.9 C) develops.  COLD THERAPY DIRECTIONS:  Ice or  gel packs can be used to reduce both pain and swelling. Ice is the most helpful within the first 24 to 48 hours after an injury or flareup from overusing a muscle or joint.  Ice is effective, has very few side effects, and is safe for most people to use.   If you expose your skin to cold temperatures for too long or without the proper protection, you can damage your skin or nerves. Watch for signs of skin damage due to cold.   HOME CARE INSTRUCTIONS  Follow these tips to use ice and cold packs safely.  Place a dry or damp towel between the ice and skin. A damp towel will cool the skin more quickly, so you may need to shorten the time that the ice is used.  For a more rapid response, add gentle compression to the ice.  Ice for no more than 10 to 20 minutes at a time. The bonier the area you are icing, the less time it will take to get the benefits of ice.  Check your skin after 5 minutes to make sure there are no signs of a poor response to cold or skin damage.  Rest 20 minutes or more in between uses.  Once your skin is numb, you can end your treatment. You can test numbness by very lightly touching your skin. The touch should be so light that you do not see the skin dimple from the pressure of your fingertip. When using ice, most people will feel these normal sensations in this order: cold, burning, aching, and numbness.  Do not use ice on someone who cannot communicate their responses to pain, such as small children or people with dementia.   HOW TO MAKE AN ICE PACK  To make an ice pack, do one of the following:  Place crushed ice or a bag of frozen vegetables in a sealable plastic bag. Squeeze out the excess air. Place this bag inside another plastic bag. Slide the bag into a pillowcase or place a damp towel between your skin and the bag.  Mix 3 parts water with 1 part rubbing alcohol. Freeze the mixture in a sealable plastic bag. When you remove the mixture from the freezer, it will be slushy.  Squeeze out the excess air. Place this bag inside another plastic bag. Slide the bag into a pillowcase or place a damp towel between your s

## 2014-05-29 NOTE — ED Notes (Signed)
PT refused pain meds.

## 2014-06-01 NOTE — ED Provider Notes (Signed)
Medical screening examination/treatment/procedure(s) were performed by non-physician practitioner and as supervising physician I was immediately available for consultation/collaboration.   EKG Interpretation None        Gwyneth SproutWhitney Simmone Cape, MD 06/01/14 1438

## 2014-09-26 ENCOUNTER — Encounter (HOSPITAL_COMMUNITY): Payer: Self-pay | Admitting: Emergency Medicine

## 2015-01-18 ENCOUNTER — Emergency Department (HOSPITAL_COMMUNITY)
Admission: EM | Admit: 2015-01-18 | Discharge: 2015-01-19 | Disposition: A | Discharge status: Home / Self Care | Payer: Medicaid Other | Attending: Emergency Medicine | Admitting: Emergency Medicine

## 2015-01-18 ENCOUNTER — Encounter (HOSPITAL_COMMUNITY): Payer: Self-pay | Admitting: *Deleted

## 2015-01-18 DIAGNOSIS — L02412 Cutaneous abscess of left axilla: Secondary | ICD-10-CM | POA: Diagnosis not present

## 2015-01-18 DIAGNOSIS — Z87891 Personal history of nicotine dependence: Secondary | ICD-10-CM | POA: Diagnosis not present

## 2015-01-18 DIAGNOSIS — R0789 Other chest pain: Secondary | ICD-10-CM

## 2015-01-18 DIAGNOSIS — L02411 Cutaneous abscess of right axilla: Secondary | ICD-10-CM | POA: Insufficient documentation

## 2015-01-18 DIAGNOSIS — Z791 Long term (current) use of non-steroidal anti-inflammatories (NSAID): Secondary | ICD-10-CM | POA: Insufficient documentation

## 2015-01-18 DIAGNOSIS — R079 Chest pain, unspecified: Secondary | ICD-10-CM | POA: Diagnosis present

## 2015-01-18 NOTE — ED Notes (Signed)
Patient presents stating she has boils under her arm and today she started with left sided chest pain.

## 2015-01-19 MED ORDER — HYDROCODONE-ACETAMINOPHEN 5-325 MG PO TABS: 1.0000 | ORAL_TABLET | Freq: Four times a day (QID) | ORAL | Status: DC | PRN

## 2015-01-19 MED ORDER — SULFAMETHOXAZOLE-TRIMETHOPRIM 800-160 MG PO TABS: 1.0000 | ORAL_TABLET | Freq: Two times a day (BID) | ORAL | Status: AC

## 2015-01-19 MED ORDER — ACETAMINOPHEN 500 MG PO TABS
1000.0000 mg | ORAL_TABLET | Freq: Once | ORAL | Status: AC
  Administered 2015-01-19: 1000 mg via ORAL
  Filled 2015-01-19: qty 2

## 2015-01-19 MED ORDER — CEPHALEXIN 500 MG PO CAPS: 500.0000 mg | ORAL_CAPSULE | Freq: Four times a day (QID) | ORAL | Status: DC

## 2015-01-19 MED ORDER — LIDOCAINE HCL (PF) 1 % IJ SOLN
5.0000 mL | Freq: Once | INTRAMUSCULAR | Status: AC
  Administered 2015-01-19: 5 mL via INTRADERMAL

## 2015-01-19 NOTE — ED Provider Notes (Signed)
CSN: [id number]     Arrival date & time 01/18/15  2206 History  This chart was scribed for Geoffery Lyons, MD by Luisa Dago, ED Scribe. This patient was seen in room A08C/A08C and the patient's care was started at 12:12 AM.    Chief Complaint  Patient presents with  . Boil under arm   . Chest Pain   The history is provided by the patient and medical records. No language interpreter was used.   HPI Comments: REINA WILTON is a 34 y.o. female who presents to the Emergency Department complaining of sudden onset left sided chest pain that started yesterday. Pt describes the pain as a "pressure" like something is "sitting on her chest".  She states that the last time shed was seen in the ED for chest pain she was diagnosed with pneumonia. Pt is also complaining of multiple abscesses to right and left axilla. Pt denies any fever, hx of HTN, hx of DM, neck pain, sore throat, visual disturbance, cough, SOB, abdominal pain, nausea, emesis, diarrhea, urinary symptoms, back pain, HA, weakness, numbness and rash as associated symptoms.    History reviewed. No pertinent past medical history. Past Surgical History  Procedure Laterality Date  . Cholecystectomy     No family history on file. History  Substance Use Topics  . Smoking status: Former Games developer  . Smokeless tobacco: Never Used  . Alcohol Use: Yes   OB History    Gravida Para Term Preterm AB TAB SAB Ectopic Multiple Living   1              Review of Systems  Constitutional: Negative for fever and chills.  HENT: Negative for rhinorrhea and sore throat.   Eyes: Negative for visual disturbance.  Respiratory: Negative for cough and shortness of breath.   Cardiovascular: Positive for chest pain. Negative for leg swelling.  Gastrointestinal: Negative for nausea, vomiting, abdominal pain and diarrhea.  Genitourinary: Negative for dysuria.  Musculoskeletal: Negative for back pain and neck pain.  Skin: Negative for rash.       Abscess   Neurological: Negative for dizziness, light-headedness and headaches.  Hematological: Does not bruise/bleed easily.  Psychiatric/Behavioral: Negative for confusion.   Allergies  Review of patient's allergies indicates no known allergies.  Home Medications   Prior to Admission medications   Medication Sig Start Date End Date Taking? Authorizing Provider  naproxen (NAPROSYN) 500 MG tablet Take 1 tablet (500 mg total) by mouth 2 (two) times daily. 05/29/14   Heather Laisure, PA-C   BP 123/63 mmHg  Pulse 84  Temp(Src) 98.3 F (36.8 C) (Oral)  Resp 18  Ht  (1.575 m)  Wt 209 lb 11.2 oz (95.119 kg)  BMI 38.34 kg/m2  SpO2 96%  LMP 01/11/2015  Physical Exam  Constitutional: She appears well-developed and well-nourished. No distress.  HENT:  Head: Normocephalic and atraumatic.  Eyes: Conjunctivae are normal. Right eye exhibits no discharge. Left eye exhibits no discharge.  Neck: Neck supple.  Cardiovascular: Normal rate, regular rhythm and normal heart sounds.  Exam reveals no gallop and no friction rub.   No murmur heard. Pulmonary/Chest: Effort normal and breath sounds normal. No respiratory distress.  Abdominal: Soft. She exhibits no distension. There is no tenderness.  Musculoskeletal: She exhibits no edema or tenderness.  Neurological: She is alert.  Skin: Skin is warm and dry.  Psychiatric: She has a normal mood and affect. Her behavior is normal. Thought content normal.  Nursing note and vitals reviewed.  ED Course  Procedures (including critical care time)  DIAGNOSTIC STUDIES: Oxygen Saturation is 96% on RA, adequate by my interpretation.    COORDINATION OF CARE: 12:15 AM- Pt advised of plan for treatment and pt agrees.  Labs Review Labs Reviewed - No data to display  Imaging Review No results found.   Date: 01/19/2015  Rate: 87  Rhythm: normal sinus rhythm  QRS Axis: normal  Intervals: normal  ST/T Wave abnormalities: normal  Conduction  Disutrbances:none  Narrative Interpretation:   Old EKG Reviewed: none available     MDM   Final diagnoses:  None    Patient is a 34 year old female who presents with complaints of chest pain and boils under both arms. The cysts were I&D and will be treated with Keflex, Bactrim, and pain medication.  The pain in the front of her chest is sharp in nature and reproducible. Her EKG is normal and she has no cardiac risk factors. She works out several times weekly without symptoms and I do not feel as though further workup is indicated into this. She will be discharged with the above medications and when necessary return.  I personally performed the services described in this documentation, which was scribed in my presence. The recorded information has been reviewed and is accurate.     Geoffery Lyonsouglas Bandon Sherwin, MD 01/19/15 [phone]

## 2015-01-19 NOTE — Discharge Instructions (Signed)
Keflex and Bactrim as prescribed.  Hydrocodone as prescribed as needed for pain.  Apply warm soaks as often as possible for the next 2 days.  Return to the emergency department if your symptoms substantially worsen or change.   Abscess An abscess is an infected area that contains a collection of pus and debris.It can occur in almost any part of the body. An abscess is also known as a furuncle or boil. CAUSES  An abscess occurs when tissue gets infected. This can occur from blockage of oil or sweat glands, infection of hair follicles, or a minor injury to the skin. As the body tries to fight the infection, pus collects in the area and creates pressure under the skin. This pressure causes pain. People with weakened immune systems have difficulty fighting infections and get certain abscesses more often.  SYMPTOMS Usually an abscess develops on the skin and becomes a painful mass that is red, warm, and tender. If the abscess forms under the skin, you may feel a moveable soft area under the skin. Some abscesses break open (rupture) on their own, but most will continue to get worse without care. The infection can spread deeper into the body and eventually into the bloodstream, causing you to feel ill.  DIAGNOSIS  Your caregiver will take your medical history and perform a physical exam. A sample of fluid may also be taken from the abscess to determine what is causing your infection. TREATMENT  Your caregiver may prescribe antibiotic medicines to fight the infection. However, taking antibiotics alone usually does not cure an abscess. Your caregiver may need to make a small cut (incision) in the abscess to drain the pus. In some cases, gauze is packed into the abscess to reduce pain and to continue draining the area. HOME CARE INSTRUCTIONS   Only take over-the-counter or prescription medicines for pain, discomfort, or fever as directed by your caregiver.  If you were prescribed antibiotics, take them  as directed. Finish them even if you start to feel better.  If gauze is used, follow your caregiver's directions for changing the gauze.  To avoid spreading the infection:  Keep your draining abscess covered with a bandage.  Wash your hands well.  Do not share personal care items, towels, or whirlpools with others.  Avoid skin contact with others.  Keep your skin and clothes clean around the abscess.  Keep all follow-up appointments as directed by your caregiver. SEEK MEDICAL CARE IF:   You have increased pain, swelling, redness, fluid drainage, or bleeding.  You have muscle aches, chills, or a general ill feeling.  You have a fever. MAKE SURE YOU:   Understand these instructions.  Will watch your condition.  Will get help right away if you are not doing well or get worse. Document Released: 08/21/2005 Document Revised: 05/12/2012 Document Reviewed: 01/24/2012 [facility] Patient Information 2015 [place], [place]. This information is not intended to replace advice given to you by your health care provider. Make sure you discuss any questions you have with your health care provider.  Chest Pain (Nonspecific) It is often hard to give a specific diagnosis for the cause of chest pain. There is always a chance that your pain could be related to something serious, such as a heart attack or a blood clot in the lungs. You need to follow up with your health care provider for further evaluation. CAUSES   Heartburn.  Pneumonia or bronchitis.  Anxiety or stress.  Inflammation around your heart (pericarditis) or lung (pleuritis  or pleurisy).  A blood clot in the lung.  A collapsed lung (pneumothorax). It can develop suddenly on its own (spontaneous pneumothorax) or from trauma to the chest.  Shingles infection (herpes zoster virus). The chest wall is composed of bones, muscles, and cartilage. Any of these can be the source of the pain.  The bones can be bruised by injury.  The  muscles or cartilage can be strained by coughing or overwork.  The cartilage can be affected by inflammation and become sore (costochondritis). DIAGNOSIS  Lab tests or other studies may be needed to find the cause of your pain. Your health care provider may have you take a test called an ambulatory electrocardiogram (ECG). An ECG records your heartbeat patterns over a 24-hour period. You may also have other tests, such as:  Transthoracic echocardiogram (TTE). During echocardiography, sound waves are used to evaluate how blood flows through your heart.  Transesophageal echocardiogram (TEE).  Cardiac monitoring. This allows your health care provider to monitor your heart rate and rhythm in real time.  Holter monitor. This is a portable device that records your heartbeat and can help diagnose heart arrhythmias. It allows your health care provider to track your heart activity for several days, if needed.  Stress tests by exercise or by giving medicine that makes the heart beat faster. TREATMENT   Treatment depends on what may be causing your chest pain. Treatment may include:  Acid blockers for heartburn.  Anti-inflammatory medicine.  Pain medicine for inflammatory conditions.  Antibiotics if an infection is present.  You may be advised to change lifestyle habits. This includes stopping smoking and avoiding alcohol, caffeine, and chocolate.  You may be advised to keep your head raised (elevated) when sleeping. This reduces the chance of acid going backward from your stomach into your esophagus. Most of the time, nonspecific chest pain will improve within 2-3 days with rest and mild pain medicine.  HOME CARE INSTRUCTIONS   If antibiotics were prescribed, take them as directed. Finish them even if you start to feel better.  For the next few days, avoid physical activities that bring on chest pain. Continue physical activities as directed.  Do not use any tobacco products, including  cigarettes, chewing tobacco, or electronic cigarettes.  Avoid drinking alcohol.  Only take medicine as directed by your health care provider.  Follow your health care provider's suggestions for further testing if your chest pain does not go away.  Keep any follow-up appointments you made. If you do not go to an appointment, you could develop lasting (chronic) problems with pain. If there is any problem keeping an appointment, call to reschedule. SEEK MEDICAL CARE IF:   Your chest pain does not go away, even after treatment.  You have a rash with blisters on your chest.  You have a fever. SEEK IMMEDIATE MEDICAL CARE IF:   You have increased chest pain or pain that spreads to your arm, neck, jaw, back, or abdomen.  You have shortness of breath.  You have an increasing cough, or you cough up blood.  You have severe back or abdominal pain.  You feel nauseous or vomit.  You have severe weakness.  You faint.  You have chills. This is an emergency. Do not wait to see if the pain will go away. Get medical help at once. Call your local emergency services (911 in [place] Do not drive yourself to the hospital. MAKE SURE YOU:   Understand these instructions.  Will watch your condition.  Will get help right away if you are not doing well or get worse. Document Released: 08/21/2005 Document Revised: 11/16/2013 Document Reviewed: 06/16/2008 [facility] Patient Information 2015 [place], [place]. This information is not intended to replace advice given to you by your health care provider. Make sure you discuss any questions you have with your health care provider.

## 2016-01-23 ENCOUNTER — Emergency Department (HOSPITAL_COMMUNITY): Payer: Medicaid Other

## 2016-01-23 ENCOUNTER — Encounter (HOSPITAL_COMMUNITY): Payer: Self-pay | Admitting: Neurology

## 2016-01-23 ENCOUNTER — Emergency Department (HOSPITAL_COMMUNITY)
Admission: EM | Admit: 2016-01-23 | Discharge: 2016-01-23 | Disposition: A | Discharge status: Home / Self Care | Payer: Medicaid Other | Attending: Emergency Medicine | Admitting: Emergency Medicine

## 2016-01-23 DIAGNOSIS — R079 Chest pain, unspecified: Secondary | ICD-10-CM | POA: Diagnosis not present

## 2016-01-23 DIAGNOSIS — Z87891 Personal history of nicotine dependence: Secondary | ICD-10-CM | POA: Insufficient documentation

## 2016-01-23 DIAGNOSIS — B349 Viral infection, unspecified: Secondary | ICD-10-CM | POA: Diagnosis not present

## 2016-01-23 DIAGNOSIS — R61 Generalized hyperhidrosis: Secondary | ICD-10-CM | POA: Diagnosis not present

## 2016-01-23 DIAGNOSIS — Z3202 Encounter for pregnancy test, result negative: Secondary | ICD-10-CM | POA: Insufficient documentation

## 2016-01-23 DIAGNOSIS — R6883 Chills (without fever): Secondary | ICD-10-CM | POA: Diagnosis present

## 2016-01-23 LAB — CBC
HEMATOCRIT: 41 % (ref 36.0–46.0)
Hemoglobin: 13.5 g/dL (ref 12.0–15.0)
MCH: 26.5 pg (ref 26.0–34.0)
MCHC: 32.9 g/dL (ref 30.0–36.0)
MCV: 80.6 fL (ref 78.0–100.0)
Platelets: 179 10*3/uL (ref 150–400)
RBC: 5.09 MIL/uL (ref 3.87–5.11)
RDW: 13.8 % (ref 11.5–15.5)
WBC: 5.3 10*3/uL (ref 4.0–10.5)

## 2016-01-23 LAB — BASIC METABOLIC PANEL
ANION GAP: 12 (ref 5–15)
BUN: 8 mg/dL (ref 6–20)
CALCIUM: 8.8 mg/dL — AB (ref 8.9–10.3)
CO2: 22 mmol/L (ref 22–32)
Chloride: 103 mmol/L (ref 101–111)
Creatinine, Ser: 0.82 mg/dL (ref 0.44–1.00)
GFR calc non Af Amer: >60 mL/min (ref >60)
Glucose, Bld: 95 mg/dL (ref 65–99)
POTASSIUM: 3.4 mmol/L — AB (ref 3.5–5.1)
Sodium: 137 mmol/L (ref 135–145)

## 2016-01-23 LAB — I-STAT TROPONIN, ED: TROPONIN I, POC: 0 ng/mL (ref 0.00–0.08)

## 2016-01-23 LAB — I-STAT BETA HCG BLOOD, ED (MC, WL, AP ONLY)

## 2016-01-23 NOTE — ED Provider Notes (Signed)
CSN: [id number]     Arrival date & time 01/23/16  1641 History   First MD Initiated Contact with Patient 01/23/16 1800     Chief Complaint  Patient presents with  . Chest Pain  . Chills   HPI  Toni Maldonado is a 35 year old female presenting with multiple complaints. She reports onset of generalized myalgias and dry cough 2-3 days ago. She states that her entire body aches. She also notes alternating diaphoresis and chills. She states that her cough is so severe that it is causing chest pain. She states that her ribs feel tender and hurt when she coughs. She endorses mild nausea and decreased appetite. She has not had vomiting episodes. She has used OTC pain relievers with mild relief. She has known sick contacts at home. She states 3 of her children and a sister have all been sick in the past week. She did not get the flu shot this year. Denies headaches, nasal congestion, rhinorrhea, sore throat, shortness of breath or abdominal pain, vomiting, diarrhea, arthralgias or joint swelling.  History reviewed. No pertinent past medical history. Past Surgical History  Procedure Laterality Date  . Cholecystectomy     No family history on file. Social History  Substance Use Topics  . Smoking status: Former Games developer  . Smokeless tobacco: Never Used  . Alcohol Use: Yes   OB History    Gravida Para Term Preterm AB TAB SAB Ectopic Multiple Living   1              Review of Systems  Constitutional: Positive for chills, diaphoresis and appetite change.  HENT: Negative for congestion, rhinorrhea and sore throat.   Respiratory: Positive for cough. Negative for shortness of breath.   Cardiovascular: Positive for chest pain.  Gastrointestinal: Positive for nausea. Negative for vomiting, abdominal pain and diarrhea.  Musculoskeletal: Positive for myalgias.  Neurological: Negative for syncope and headaches.  All other systems reviewed and are negative.     Allergies  Review of patient's allergies  indicates no known allergies.  Home Medications   Prior to Admission medications   Medication Sig Start Date End Date Taking? Authorizing Provider  cephALEXin (KEFLEX) 500 MG capsule Take 1 capsule (500 mg total) by mouth 4 (four) times daily. Patient not taking: Reported on 01/23/2016 01/19/15   Geoffery Lyons, MD  HYDROcodone-acetaminophen [facility]) 5-325 MG per tablet Take 1-2 tablets by mouth every 6 (six) hours as needed. Patient not taking: Reported on 01/23/2016 01/19/15   Geoffery Lyons, MD  naproxen (NAPROSYN) 500 MG tablet Take 1 tablet (500 mg total) by mouth 2 (two) times daily. Patient not taking: Reported on 01/23/2016 05/29/14   Santiago Glad, PA-C   BP 106/83 mmHg  Pulse 89  Temp(Src) 98.9 F (37.2 C) (Oral)  Resp 18  Ht  (1.575 m)  Wt 92.987 kg  BMI 37.49 kg/m2  SpO2 99%  LMP 12/27/2015 Physical Exam  Constitutional: She appears well-developed and well-nourished. No distress.  Nontoxic-appearing  HENT:  Head: Normocephalic and atraumatic.  Mouth/Throat: Oropharynx is clear and moist. No oropharyngeal exudate.  Eyes: Conjunctivae and EOM are normal. Right eye exhibits no discharge. Left eye exhibits no discharge. No scleral icterus.  Neck: Normal range of motion. Neck supple.  Cardiovascular: Normal rate, regular rhythm and normal heart sounds.   Pulmonary/Chest: Effort normal and breath sounds normal. No respiratory distress. She has no wheezes. She has no rales.  Abdominal: Soft. There is no tenderness. There is no rebound and  no guarding.  Musculoskeletal: Normal range of motion.  Lymphadenopathy:    She has no cervical adenopathy.  Neurological: She is alert. Coordination normal.  Skin: Skin is warm and dry.  Psychiatric: She has a normal mood and affect. Her behavior is normal.  Nursing note and vitals reviewed.   ED Course  Procedures (including critical care time) Labs Review Labs Reviewed  Maldonado METABOLIC PANEL - Abnormal; Notable for the following:     Potassium 3.4 (*)    Calcium 8.8 (*)    All other components within normal limits  CBC  I-STAT TROPOININ, ED  I-STAT BETA HCG BLOOD, ED ([facility], [facility], [facility] ONLY)    Imaging Review Dg Chest 2 View  01/23/2016  CLINICAL DATA:  Chest pain, shortness breath common nausea, and fever for 2 days. EXAM: CHEST  2 VIEW COMPARISON:  None. FINDINGS: The heart size and mediastinal contours are within normal limits. Both lungs are clear. Mild thoracic spine degenerative changes again noted. IMPRESSION: No active cardiopulmonary disease. Electronically Signed   By: Myles Rosenthal M.D.   On: 01/23/2016 17:16   I have personally reviewed and evaluated these images and lab results as part of my medical decision-making.   EKG Interpretation   Date/Time:  Tuesday January 23 2016 16:50:49 EST Ventricular Rate:  70 PR Interval:  124 QRS Duration: 82 QT Interval:  396 QTC Calculation: 427 R Axis:   49 Text Interpretation:  Normal sinus rhythm Normal ECG No significant change  since last tracing Confirmed by STEINL  MD, Caryn Bee ([id number]) on 01/23/2016  6:37:25 PM      MDM   Final diagnoses:  Viral illness   35 year old female with known sick contacts presenting with subjective fevers, chills, cough, chest pain, generalized myalgias and nausea x 2-3 days. Patient is hemodynamically stable and in no acute distress. Afebrile. Nontoxic appearing. Lungs clear to auscultation bilaterally. No oropharyngeal erythema or exudate. Abdomen is soft, nontender without peritoneal signs. Lab work largely unremarkable. Potassium slightly low at 3.4. Patient declined Kdur as she states this makes her vomit. Troponin negative with a nonischemic EKG. Chest x-ray without consolidation. Presentation consistent with viral illness. Discussed symptomatic care and fluid hydration. Return precautions given in discharge paperwork and discussed with pt at bedside. Pt stable for discharge     Alveta Heimlich, PA-C 01/23/16 1853  Cathren Laine, MD 01/23/16 2314

## 2016-01-23 NOTE — Discharge Instructions (Signed)
Viral Infections °A viral infection can be caused by different types of viruses. Most viral infections are not serious and resolve on their own. However, some infections may cause severe symptoms and may lead to further complications. °SYMPTOMS °Viruses can frequently cause: °· Minor sore throat. °· Aches and pains. °· Headaches. °· Runny nose. °· Different types of rashes. °· Watery eyes. °· Tiredness. °· Cough. °· Loss of appetite. °· Gastrointestinal infections, resulting in nausea, vomiting, and diarrhea. °These symptoms do not respond to antibiotics because the infection is not caused by bacteria. However, you might catch a bacterial infection following the viral infection. This is sometimes called a "superinfection." Symptoms of such a bacterial infection may include: °· Worsening sore throat with pus and difficulty swallowing. °· Swollen neck glands. °· Chills and a high or persistent fever. °· Severe headache. °· Tenderness over the sinuses. °· Persistent overall ill feeling (malaise), muscle aches, and tiredness (fatigue). °· Persistent cough. °· Yellow, green, or brown mucus production with coughing. °HOME CARE INSTRUCTIONS  °· Only take over-the-counter or prescription medicines for pain, discomfort, diarrhea, or fever as directed by your caregiver. °· Drink enough water and fluids to keep your urine clear or pale yellow. Sports drinks can provide valuable electrolytes, sugars, and hydration. °· Get plenty of rest and maintain proper nutrition. Soups and broths with crackers or rice are fine. °SEEK IMMEDIATE MEDICAL CARE IF:  °· You have severe headaches, shortness of breath, chest pain, neck pain, or an unusual rash. °· You have uncontrolled vomiting, diarrhea, or you are unable to keep down fluids. °· You or your child has an oral temperature above 102° F (38.9° C), not controlled by medicine. °· Your baby is older than 3 months with a rectal temperature of 102° F (38.9° C) or higher. °· Your baby is 3  months old or younger with a rectal temperature of 100.4° F (38° C) or higher. °MAKE SURE YOU:  °· Understand these instructions. °· Will watch your condition. °· Will get help right away if you are not doing well or get worse. °  °This information is not intended to replace advice given to you by your health care provider. Make sure you discuss any questions you have with your health care provider. °  °Document Released: 08/21/2005 Document Revised: 02/03/2012 Document Reviewed: 04/19/2015 °Elsevier Interactive Patient Education ©2016 [employer] Inc. ° °

## 2016-01-23 NOTE — ED Notes (Signed)
Pt reports cough, cp, chills, for several days. Hurts all over, cough is non-productive

## 2016-05-12 ENCOUNTER — Emergency Department (HOSPITAL_COMMUNITY): Payer: Medicaid Other

## 2016-05-12 ENCOUNTER — Emergency Department (HOSPITAL_COMMUNITY)
Admission: EM | Admit: 2016-05-12 | Discharge: 2016-05-12 | Disposition: A | Discharge status: Home / Self Care | Payer: Medicaid Other | Attending: Emergency Medicine | Admitting: Emergency Medicine

## 2016-05-12 ENCOUNTER — Encounter (HOSPITAL_COMMUNITY): Payer: Self-pay | Admitting: *Deleted

## 2016-05-12 DIAGNOSIS — Y9301 Activity, walking, marching and hiking: Secondary | ICD-10-CM | POA: Diagnosis not present

## 2016-05-12 DIAGNOSIS — Y999 Unspecified external cause status: Secondary | ICD-10-CM | POA: Diagnosis not present

## 2016-05-12 DIAGNOSIS — S93601A Unspecified sprain of right foot, initial encounter: Secondary | ICD-10-CM | POA: Diagnosis not present

## 2016-05-12 DIAGNOSIS — X509XXA Other and unspecified overexertion or strenuous movements or postures, initial encounter: Secondary | ICD-10-CM | POA: Insufficient documentation

## 2016-05-12 DIAGNOSIS — Z87891 Personal history of nicotine dependence: Secondary | ICD-10-CM | POA: Insufficient documentation

## 2016-05-12 DIAGNOSIS — S99922A Unspecified injury of left foot, initial encounter: Secondary | ICD-10-CM | POA: Diagnosis present

## 2016-05-12 DIAGNOSIS — Y929 Unspecified place or not applicable: Secondary | ICD-10-CM | POA: Insufficient documentation

## 2016-05-12 NOTE — ED Provider Notes (Signed)
CSN: [id number]     Arrival date & time 05/12/16  1515 History  By signing my name below, I, Tanda Rockers, attest that this documentation has been prepared under the direction and in the presence of [employer], PA-C. Electronically Signed: Tanda Rockers, ED Scribe. 05/12/2016. 3:38 PM.   Chief Complaint  Patient presents with  . Ankle Pain   The history is provided by the patient. No language interpreter was used.    HPI Comments: Toni Maldonado is a 35 y.o. female who presents to the Emergency Department complaining of gradual onset, constant, right lateral foot pain that began this morning. Pt states that she was wearing wedges last night and inverted her foot while walking which she believes is causing the pain. Pt is unable to bear weight onto the foot due to pain. Denies weakness, numbness, tingling, or any other associated symptoms.   History reviewed. No pertinent past medical history. Past Surgical History  Procedure Laterality Date  . Cholecystectomy     History reviewed. No pertinent family history. Social History  Substance Use Topics  . Smoking status: Former Games developer  . Smokeless tobacco: Never Used  . Alcohol Use: Yes   OB History    Gravida Para Term Preterm AB TAB SAB Ectopic Multiple Living   1              Review of Systems  All other systems reviewed and are negative.  Allergies  Review of patient's allergies indicates no known allergies.  Home Medications   Prior to Admission medications   Medication Sig Start Date End Date Taking? Authorizing Provider  cephALEXin (KEFLEX) 500 MG capsule Take 1 capsule (500 mg total) by mouth 4 (four) times daily. Patient not taking: Reported on 01/23/2016 01/19/15   Geoffery Lyons, MD  HYDROcodone-acetaminophen [facility]) 5-325 MG per tablet Take 1-2 tablets by mouth every 6 (six) hours as needed. Patient not taking: Reported on 01/23/2016 01/19/15   Geoffery Lyons, MD  naproxen (NAPROSYN) 500 MG tablet Take 1 tablet (500 mg  total) by mouth 2 (two) times daily. Patient not taking: Reported on 01/23/2016 05/29/14   Santiago Glad, PA-C   BP 112/64 mmHg  Pulse 85  Temp(Src) 98.8 F (37.1 C) (Oral)  Resp 16  SpO2 100%   Physical Exam  Constitutional: She is oriented to person, place, and time. She appears well-developed and well-nourished. No distress.  HENT:  Head: Normocephalic and atraumatic.  Eyes: Conjunctivae and EOM are normal.  Neck: Neck supple. No tracheal deviation present.  Cardiovascular: Normal rate.   Pulmonary/Chest: Effort normal. No respiratory distress.  Musculoskeletal: Normal range of motion. She exhibits tenderness.  TTP of the base of the 5th metatarsal on right  Neurological: She is alert and oriented to person, place, and time.  Skin: Skin is warm and dry.  Psychiatric: She has a normal mood and affect. Her behavior is normal.  Nursing note and vitals reviewed.   ED Course  Procedures (including critical care time)  DIAGNOSTIC STUDIES: Oxygen Saturation is 100% on RA, normal by my interpretation.    COORDINATION OF CARE: 3:38 PM-Discussed treatment plan which includes DG L Foot with pt at bedside and pt agreed to plan.   Labs Review Labs Reviewed - No data to display  Imaging Review Dg Foot Complete Right  05/12/2016  CLINICAL DATA:  Lateral right foot pain for 1 day. No known injury. Initial encounter. EXAM: RIGHT FOOT COMPLETE - 3+ VIEW COMPARISON:  None. FINDINGS: No acute bony  or joint abnormality is identified. Dorsal plantar calcaneal spur is noted. There is a large focus of sclerosis and irregularity of subchondral bone in the navicular at the articulation of the navicular and medial cuneiform. Area of abnormality measures approximately 1.6 cm transverse. Mild secondary degenerative disease about the articulation of the navicular and medial cuneiform is noted. IMPRESSION: No acute abnormality. 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 Electronically Signed   By: Drusilla Kannerhomas  Dalessio M.D.   On: 05/12/2016 16:23   I have personally reviewed and evaluated these images and lab results as part of my medical decision-making.   EKG Interpretation None      MDM   Final diagnoses:  Foot sprain, right, initial encounter   Labs: None  Imaging: DG R Foot  Consults:  Therapeutics:  Discharge Meds:   Assessment/Plan:  35 year old female presents today with foot sprain. She has no obvious abnormalities, tenderness to palpation. She has negative plain films, ambulates with minimal difficulty. She'll be instructed to use ice, ibuprofen, follow-up with orthopedics in symptoms continue to persist. Patient verbalized understanding and agreement today's plan   I personally performed the services described in this documentation, which was scribed in my presence. The recorded information has been reviewed and is accurate.    Eyvonne MechanicJeffrey Koraima Albertsen, PA-C 05/12/16 1659  Gerhard Munchobert Lockwood, MD 05/13/16 1750

## 2016-05-12 NOTE — ED Notes (Signed)
Pt reports last night  She turned her ankle  While walking in [place].

## 2016-05-12 NOTE — Discharge Instructions (Signed)

## 2016-05-12 NOTE — ED Notes (Signed)
Pt refused ice to ankle

## 2017-02-19 ENCOUNTER — Encounter (HOSPITAL_COMMUNITY): Payer: Self-pay | Admitting: Emergency Medicine

## 2017-02-19 ENCOUNTER — Emergency Department (HOSPITAL_COMMUNITY)
Admission: EM | Admit: 2017-02-19 | Discharge: 2017-02-19 | Disposition: A | Discharge status: Home / Self Care | Payer: Medicaid Other | Attending: Emergency Medicine | Admitting: Emergency Medicine

## 2017-02-19 DIAGNOSIS — Y999 Unspecified external cause status: Secondary | ICD-10-CM | POA: Diagnosis not present

## 2017-02-19 DIAGNOSIS — Z79899 Other long term (current) drug therapy: Secondary | ICD-10-CM | POA: Insufficient documentation

## 2017-02-19 DIAGNOSIS — Y939 Activity, unspecified: Secondary | ICD-10-CM | POA: Insufficient documentation

## 2017-02-19 DIAGNOSIS — T161XXA Foreign body in right ear, initial encounter: Secondary | ICD-10-CM | POA: Insufficient documentation

## 2017-02-19 DIAGNOSIS — Y929 Unspecified place or not applicable: Secondary | ICD-10-CM | POA: Diagnosis not present

## 2017-02-19 DIAGNOSIS — Z87891 Personal history of nicotine dependence: Secondary | ICD-10-CM | POA: Diagnosis not present

## 2017-02-19 DIAGNOSIS — X58XXXA Exposure to other specified factors, initial encounter: Secondary | ICD-10-CM | POA: Insufficient documentation

## 2017-02-19 NOTE — ED Triage Notes (Signed)
Pt sts bug flew in left ear; pt sts put oil in ear to try and get out but unsuccessful

## 2017-02-19 NOTE — ED Provider Notes (Signed)
[facility] DEPT Provider Note   CSN: [id number] Arrival date & time: 02/19/17  1553  By signing my name below, I, Toni Maldonado, attest that this documentation has been prepared under the direction and in the presence of non-physician practitioner, Toni Buffalo, NP. Electronically Signed: [place] Maldonado, Scribe. 02/19/2017. 4:30 PM.  History   Chief Complaint Chief Complaint  Patient presents with  . Foreign Body in Ear   The history is provided by the patient. No language interpreter was used.  Foreign Body in Ear  This is a new problem. The current episode started 3 to 5 hours ago. The problem occurs constantly. The problem has not changed since onset.Pertinent negatives include no headaches. Nothing aggravates the symptoms. Nothing relieves the symptoms. She has tried nothing for the symptoms.   HPI Comments: Toni Maldonado is a 36 y.o. female who presents to the Emergency Department complaining of a left ear foreign body that started today. She states a bug flew into her left ear. She poured oil into her ear canal PTA without relief. She denies any nausea, headache, or other complaints.   History reviewed. No pertinent past medical history.  There are no active problems to display for this patient.   Past Surgical History:  Procedure Laterality Date  . CHOLECYSTECTOMY      OB History    Gravida Para Term Preterm AB Living   1             SAB TAB Ectopic Multiple Live Births                   Home Medications    Prior to Admission medications   Medication Sig Start Date End Date Taking? Authorizing Provider  cephALEXin (KEFLEX) 500 MG capsule Take 1 capsule (500 mg total) by mouth 4 (four) times daily. Patient not taking: Reported on 01/23/2016 01/19/15   Geoffery Lyons, MD  HYDROcodone-acetaminophen [facility]) 5-325 MG per tablet Take 1-2 tablets by mouth every 6 (six) hours as needed. Patient not taking: Reported on 01/23/2016 01/19/15   Geoffery Lyons, MD  naproxen  (NAPROSYN) 500 MG tablet Take 1 tablet (500 mg total) by mouth 2 (two) times daily. Patient not taking: Reported on 01/23/2016 05/29/14   Santiago Glad, PA-C    Family History History reviewed. No pertinent family history.  Social History Social History  Substance Use Topics  . Smoking status: Former Games developer  . Smokeless tobacco: Never Used  . Alcohol use Yes     Allergies   Patient has no known allergies.   Review of Systems Review of Systems  HENT: Positive for ear pain (Discomfort secondary to left-sided foreign body).   Gastrointestinal: Negative for nausea.  Neurological: Negative for dizziness and headaches.     Physical Exam Updated Vital Signs BP (!) 146/91 (BP Location: Left Arm)   Pulse 95   Temp 98.1 F (36.7 C) (Oral)   Resp 16   Ht 5\' 2"  (1.575 m)   Wt 230 lb (104.3 kg)   SpO2 100%   BMI 42.07 kg/m   Physical Exam  Constitutional: She appears well-developed and well-nourished. No distress.  HENT:  Head: Normocephalic.  Left ear foreign body present. Removed via warm water irrigation.   Eyes: Conjunctivae are normal.  Neck: Neck supple.  Cardiovascular: Normal rate.   Pulmonary/Chest: Effort normal.  Musculoskeletal: Normal range of motion.  Neurological: She is alert.  Skin: Skin is warm and dry.  Psychiatric: She has a normal mood and affect.  Nursing note and vitals reviewed.    ED Treatments / Results  DIAGNOSTIC STUDIES: Oxygen Saturation is 100% on RA, normal by my interpretation.    COORDINATION OF CARE: 4:34 PM- Pt advised of plan for treatment and pt agrees.  Labs (all labs ordered are listed, but only abnormal results are displayed) Labs Reviewed - No data to display  Procedures: left ear irrigated with warm water using 20 cc syringe and #18 angicath. Patient tolerated the procedure without complications. FB removed without difficulty. Ear re examined and normal TM and no FB. Procedures (including critical care  time)  Medications Ordered in ED Medications - No data to display   Initial Impression / Assessment and Plan / ED Course  I have reviewed the triage vital signs and the nursing notes. Final Clinical Impressions(s) / ED Diagnoses  36 y.o. female with f/b to the left ear stable for d/c without problems after removal.  Final diagnoses:  Foreign body of right ear, initial encounter    New Prescriptions Discharge Medication List as of 02/19/2017  4:40 PM    I personally performed the services described in this documentation, which was scribed in my presence. The recorded information has been reviewed and is accurate.     [facility] Toni OchM Tymere Depuy, NP 02/19/17 1708    Toni Planan Floyd, DO 02/19/17 [phone]

## 2017-09-14 ENCOUNTER — Ambulatory Visit (HOSPITAL_COMMUNITY)
Admission: EM | Admit: 2017-09-14 | Discharge: 2017-09-14 | Disposition: A | Discharge status: Home / Self Care | Payer: Self-pay | Attending: Emergency Medicine | Admitting: Emergency Medicine

## 2017-09-14 ENCOUNTER — Encounter (HOSPITAL_COMMUNITY): Payer: Self-pay | Admitting: Emergency Medicine

## 2017-09-14 DIAGNOSIS — R112 Nausea with vomiting, unspecified: Secondary | ICD-10-CM

## 2017-09-14 DIAGNOSIS — R519 Headache, unspecified: Secondary | ICD-10-CM

## 2017-09-14 DIAGNOSIS — R51 Headache: Secondary | ICD-10-CM

## 2017-09-14 DIAGNOSIS — R42 Dizziness and giddiness: Secondary | ICD-10-CM

## 2017-09-14 MED ORDER — KETOROLAC TROMETHAMINE 60 MG/2ML IM SOLN
60.0000 mg | Freq: Once | INTRAMUSCULAR | Status: AC
  Administered 2017-09-14: 60 mg via INTRAMUSCULAR

## 2017-09-14 MED ORDER — ONDANSETRON 4 MG PO TBDP
ORAL_TABLET | ORAL | Status: AC
  Filled 2017-09-14: qty 1

## 2017-09-14 MED ORDER — PROMETHAZINE HCL 25 MG PO TABS: 25.0000 mg | ORAL_TABLET | Freq: Four times a day (QID) | ORAL | 0 refills | Status: DC | PRN

## 2017-09-14 MED ORDER — ONDANSETRON 4 MG PO TBDP
4.0000 mg | ORAL_TABLET | Freq: Once | ORAL | Status: AC
  Administered 2017-09-14: 4 mg via ORAL

## 2017-09-14 MED ORDER — KETOROLAC TROMETHAMINE 60 MG/2ML IM SOLN
INTRAMUSCULAR | Status: AC
  Filled 2017-09-14: qty 2

## 2017-09-14 NOTE — ED Provider Notes (Signed)
[facility]    CSN: [id number] Arrival date & time: 09/14/17  1521     History   Chief Complaint Chief Complaint  Patient presents with  . Headache    HPI Toni Maldonado is a 36 y.o. female.   Toni Maldonado presents with complaints of intermittent headache with a "Crawling" sensation to her head for the past week. She denies history of migraine headache. It is a pressure as well, at times starts from her neck and radiates up around to her forehead. Denies current pain. Coughing or bending over does make it worse, movement makes it worse. Denies pregnancy or new stressors in her life, no  Head injury. When pain is severe she can feel dizzy. Denies vision changes. Light can exacerbate the pain as well as loud sounds. 6 days ago the pain caused nausea and vomiting, has not vomited since. She tried ibuprofen yesterday which did not help. She feels fatigue due to the pain. She wears prescription glasses.       History reviewed. No pertinent past medical history.  There are no active problems to display for this patient.   Past Surgical History:  Procedure Laterality Date  . CHOLECYSTECTOMY      OB History    Gravida Para Term Preterm AB Living   1             SAB TAB Ectopic Multiple Live Births                   Home Medications    Prior to Admission medications   Medication Sig Start Date End Date Taking? Authorizing Provider  cephALEXin (KEFLEX) 500 MG capsule Take 1 capsule (500 mg total) by mouth 4 (four) times daily. Patient not taking: Reported on 01/23/2016 01/19/15   Geoffery Lyonselo, Douglas, MD  HYDROcodone-acetaminophen [facility]) 5-325 MG per tablet Take 1-2 tablets by mouth every 6 (six) hours as needed. Patient not taking: Reported on 01/23/2016 01/19/15   Geoffery Lyonselo, Douglas, MD  naproxen (NAPROSYN) 500 MG tablet Take 1 tablet (500 mg total) by mouth 2 (two) times daily. Patient not taking: Reported on 01/23/2016 05/29/14   Santiago GladLaisure, Heather, PA-C  promethazine (PHENERGAN) 25  MG tablet Take 1 tablet (25 mg total) by mouth every 6 (six) hours as needed for nausea or vomiting. 09/14/17   Georgetta HaberBurky, Natalie B, NP    Family History History reviewed. No pertinent family history.  Social History Social History  Substance Use Topics  . Smoking status: Former Games developermoker  . Smokeless tobacco: Never Used  . Alcohol use Yes     Allergies   Patient has no known allergies.   Review of Systems Review of Systems  Constitutional: Negative.   HENT: Negative.   Eyes: Negative.   Gastrointestinal: Positive for nausea and vomiting.  Genitourinary: Negative.   Musculoskeletal: Negative.   Neurological: Positive for dizziness and headaches. Negative for tremors, seizures, syncope, weakness, light-headedness and numbness.     Physical Exam Triage Vital Signs ED Triage Vitals  Enc Vitals Group     BP 09/14/17 1553 139/71     Pulse Rate 09/14/17 1553 69     Resp 09/14/17 1553 18     Temp 09/14/17 1553 98.9 F (37.2 C)     Temp Source 09/14/17 1553 Oral     SpO2 09/14/17 1553 100 %     Weight --      Height --      Head Circumference --      Peak  Flow --      Pain Score 09/14/17 1551 5     Pain Loc --      Pain Edu? --      Excl. in GC? --    No data found.   Updated Vital Signs BP 139/71 (BP Location: Left Arm)   Pulse 69   Temp 98.9 F (37.2 C) (Oral)   Resp 18   LMP 08/25/2017   SpO2 100%   Visual Acuity Right Eye Distance:   Left Eye Distance:   Bilateral Distance:    Right Eye Near:   Left Eye Near:    Bilateral Near:     Physical Exam  Constitutional: She is oriented to person, place, and time. She appears well-developed and well-nourished.  Eyes: Pupils are equal, round, and reactive to light. EOM are normal.  Neck: Normal range of motion.  Cardiovascular: Normal rate and regular rhythm.   Pulmonary/Chest: Effort normal and breath sounds normal.  Neurological: She is alert and oriented to person, place, and time. She has normal strength.  She displays normal reflexes. No cranial nerve deficit or sensory deficit. She displays a negative Romberg sign. Coordination and gait normal.     UC Treatments / Results  Labs (all labs ordered are listed, but only abnormal results are displayed) Labs Reviewed - No data to display  EKG  EKG Interpretation None       Radiology No results found.  Procedures Procedures (including critical care time)  Medications Ordered in UC Medications  ketorolac (TORADOL) injection 60 mg (60 mg Intramuscular Given 09/14/17 1748)  ondansetron (ZOFRAN-ODT) disintegrating tablet 4 mg (4 mg Oral Given 09/14/17 1748)     Initial Impression / Assessment and Plan / UC Course  I have reviewed the triage vital signs and the nursing notes.  Pertinent labs & imaging results that were available during my care of the patient were reviewed by me and considered in my medical decision making (see chart for details).     Patient currently without pain. Without acute findings on exam, neurologically intact. Discussed migraine vs pathologic headache with patient at length. Patient agreeable to trying toradol and zofran in clinic now, phenergan at home if needed, with close follow up with PCP or in ED if headache returns, becomes severe, or develops additional symptoms such as neurological changes. Patient verbalized understanding and agreeable to plan. Rest, limit screen time and light. Push fluids.   Final Clinical Impressions(s) / UC Diagnoses   Final diagnoses:  Bad headache    New Prescriptions Discharge Medication List as of 09/14/2017  5:39 PM    START taking these medications   Details  promethazine (PHENERGAN) 25 MG tablet Take 1 tablet (25 mg total) by mouth every 6 (six) hours as needed for nausea or vomiting., Starting Sun 09/14/2017, Normal         Controlled Substance Prescriptions [place] Controlled Substance Registry consulted? Not Applicable   Georgetta Haber, NP 09/14/17 1827

## 2017-09-14 NOTE — Discharge Instructions (Signed)
Will try medications in clinic today. If headache returns at home may try the additional anti nausea medication prescribed as well as ibuprofen (no ibuprofen for 6 hours after today's visit. If your symptoms worsen or do not improve in the next week please establish with a primary care provider or if severe go to the ED.

## 2017-09-14 NOTE — ED Triage Notes (Signed)
Pt c/o intermittent HA onset 1 week associated w/nausea, vomiting  Taking Ibuprofen w/no relief.   A&O x4... NAD... Ambulatory

## 2018-07-13 DIAGNOSIS — H5213 Myopia, bilateral: Secondary | ICD-10-CM | POA: Diagnosis not present

## 2018-07-21 DIAGNOSIS — H5213 Myopia, bilateral: Secondary | ICD-10-CM | POA: Diagnosis not present

## 2018-07-21 DIAGNOSIS — H52201 Unspecified astigmatism, right eye: Secondary | ICD-10-CM | POA: Diagnosis not present

## 2019-01-30 ENCOUNTER — Emergency Department (HOSPITAL_COMMUNITY)
Admission: EM | Admit: 2019-01-30 | Discharge: 2019-01-31 | Disposition: A | Discharge status: Home / Self Care | Payer: Medicaid Other | Attending: Emergency Medicine | Admitting: Emergency Medicine

## 2019-01-30 ENCOUNTER — Other Ambulatory Visit: Payer: Self-pay

## 2019-01-30 ENCOUNTER — Encounter (HOSPITAL_COMMUNITY): Payer: Self-pay | Admitting: Emergency Medicine

## 2019-01-30 DIAGNOSIS — R109 Unspecified abdominal pain: Secondary | ICD-10-CM | POA: Insufficient documentation

## 2019-01-30 DIAGNOSIS — Z5321 Procedure and treatment not carried out due to patient leaving prior to being seen by health care provider: Secondary | ICD-10-CM | POA: Insufficient documentation

## 2019-01-30 LAB — COMPREHENSIVE METABOLIC PANEL
ALK PHOS: 79 U/L (ref 38–126)
ALT: 25 U/L (ref 0–44)
AST: 22 U/L (ref 15–41)
Albumin: 4 g/dL (ref 3.5–5.0)
Anion gap: 13 (ref 5–15)
BUN: 14 mg/dL (ref 6–20)
CALCIUM: 9.4 mg/dL (ref 8.9–10.3)
CO2: 23 mmol/L (ref 22–32)
CREATININE: 0.84 mg/dL (ref 0.44–1.00)
Chloride: 101 mmol/L (ref 98–111)
GFR calc non Af Amer: >60 mL/min (ref >60)
Glucose, Bld: 132 mg/dL — ABNORMAL HIGH (ref 70–99)
Potassium: 3.6 mmol/L (ref 3.5–5.1)
SODIUM: 137 mmol/L (ref 135–145)
Total Bilirubin: 0.6 mg/dL (ref 0.3–1.2)
Total Protein: 8 g/dL (ref 6.5–8.1)

## 2019-01-30 LAB — CBC
HCT: 42.2 % (ref 36.0–46.0)
Hemoglobin: 13.3 g/dL (ref 12.0–15.0)
MCH: 25.6 pg — AB (ref 26.0–34.0)
MCHC: 31.5 g/dL (ref 30.0–36.0)
MCV: 81.2 fL (ref 80.0–100.0)
NRBC: 0 % (ref 0.0–0.2)
Platelets: 299 10*3/uL (ref 150–400)
RBC: 5.2 MIL/uL — ABNORMAL HIGH (ref 3.87–5.11)
RDW: 14.5 % (ref 11.5–15.5)
WBC: 19.2 10*3/uL — ABNORMAL HIGH (ref 4.0–10.5)

## 2019-01-30 LAB — LIPASE, BLOOD: Lipase: 23 U/L (ref 11–51)

## 2019-01-30 LAB — I-STAT TROPONIN, ED: Troponin i, poc: 0 ng/mL (ref 0.00–0.08)

## 2019-01-30 MED ORDER — ONDANSETRON 4 MG PO TBDP
4.0000 mg | ORAL_TABLET | Freq: Once | ORAL | Status: AC
Start: 2019-01-30 — End: 2019-01-30
  Administered 2019-01-30: 4 mg via ORAL
  Filled 2019-01-30: qty 1

## 2019-01-30 MED ORDER — SODIUM CHLORIDE 0.9% FLUSH: 3.0000 mL | Freq: Once | INTRAVENOUS | Status: DC

## 2019-01-30 NOTE — ED Triage Notes (Signed)
Pt c/o abdominal pain with nausea/vomiting/diarrhea x 3 hours.

## 2019-01-31 NOTE — ED Notes (Signed)
Patient advised to stay, patient decided to leave.

## 2019-04-12 DIAGNOSIS — Z79899 Other long term (current) drug therapy: Secondary | ICD-10-CM | POA: Diagnosis not present

## 2019-04-14 DIAGNOSIS — Z79899 Other long term (current) drug therapy: Secondary | ICD-10-CM | POA: Diagnosis not present

## 2019-04-20 DIAGNOSIS — Z79899 Other long term (current) drug therapy: Secondary | ICD-10-CM | POA: Diagnosis not present

## 2019-04-22 DIAGNOSIS — Z79899 Other long term (current) drug therapy: Secondary | ICD-10-CM | POA: Diagnosis not present

## 2019-04-27 DIAGNOSIS — Z79899 Other long term (current) drug therapy: Secondary | ICD-10-CM | POA: Diagnosis not present

## 2019-04-29 DIAGNOSIS — Z79899 Other long term (current) drug therapy: Secondary | ICD-10-CM | POA: Diagnosis not present

## 2019-05-26 DIAGNOSIS — Z79899 Other long term (current) drug therapy: Secondary | ICD-10-CM | POA: Diagnosis not present

## 2019-05-28 DIAGNOSIS — Z79899 Other long term (current) drug therapy: Secondary | ICD-10-CM | POA: Diagnosis not present

## 2019-06-01 DIAGNOSIS — Z79899 Other long term (current) drug therapy: Secondary | ICD-10-CM | POA: Diagnosis not present

## 2019-06-03 DIAGNOSIS — Z79899 Other long term (current) drug therapy: Secondary | ICD-10-CM | POA: Diagnosis not present

## 2019-06-08 DIAGNOSIS — Z79899 Other long term (current) drug therapy: Secondary | ICD-10-CM | POA: Diagnosis not present

## 2019-06-10 DIAGNOSIS — Z79899 Other long term (current) drug therapy: Secondary | ICD-10-CM | POA: Diagnosis not present

## 2019-06-15 DIAGNOSIS — Z79899 Other long term (current) drug therapy: Secondary | ICD-10-CM | POA: Diagnosis not present

## 2019-06-17 DIAGNOSIS — Z79899 Other long term (current) drug therapy: Secondary | ICD-10-CM | POA: Diagnosis not present

## 2019-06-22 DIAGNOSIS — Z79899 Other long term (current) drug therapy: Secondary | ICD-10-CM | POA: Diagnosis not present

## 2019-06-24 DIAGNOSIS — Z79899 Other long term (current) drug therapy: Secondary | ICD-10-CM | POA: Diagnosis not present

## 2019-06-29 DIAGNOSIS — Z79899 Other long term (current) drug therapy: Secondary | ICD-10-CM | POA: Diagnosis not present

## 2019-07-01 DIAGNOSIS — Z79899 Other long term (current) drug therapy: Secondary | ICD-10-CM | POA: Diagnosis not present

## 2019-07-06 DIAGNOSIS — Z79899 Other long term (current) drug therapy: Secondary | ICD-10-CM | POA: Diagnosis not present

## 2019-07-08 DIAGNOSIS — Z79899 Other long term (current) drug therapy: Secondary | ICD-10-CM | POA: Diagnosis not present

## 2019-07-13 DIAGNOSIS — Z79899 Other long term (current) drug therapy: Secondary | ICD-10-CM | POA: Diagnosis not present

## 2019-07-15 DIAGNOSIS — Z79899 Other long term (current) drug therapy: Secondary | ICD-10-CM | POA: Diagnosis not present

## 2019-07-19 DIAGNOSIS — Z79899 Other long term (current) drug therapy: Secondary | ICD-10-CM | POA: Diagnosis not present

## 2019-07-21 DIAGNOSIS — Z79899 Other long term (current) drug therapy: Secondary | ICD-10-CM | POA: Diagnosis not present

## 2019-07-26 DIAGNOSIS — Z79899 Other long term (current) drug therapy: Secondary | ICD-10-CM | POA: Diagnosis not present

## 2019-07-28 DIAGNOSIS — Z79899 Other long term (current) drug therapy: Secondary | ICD-10-CM | POA: Diagnosis not present

## 2019-08-04 DIAGNOSIS — Z79899 Other long term (current) drug therapy: Secondary | ICD-10-CM | POA: Diagnosis not present

## 2019-08-09 DIAGNOSIS — Z79899 Other long term (current) drug therapy: Secondary | ICD-10-CM | POA: Diagnosis not present

## 2019-08-11 DIAGNOSIS — Z79899 Other long term (current) drug therapy: Secondary | ICD-10-CM | POA: Diagnosis not present

## 2019-08-16 DIAGNOSIS — Z79899 Other long term (current) drug therapy: Secondary | ICD-10-CM | POA: Diagnosis not present

## 2020-06-25 ENCOUNTER — Emergency Department (HOSPITAL_COMMUNITY): Payer: Medicaid Other

## 2020-06-25 ENCOUNTER — Other Ambulatory Visit: Payer: Self-pay

## 2020-06-25 ENCOUNTER — Emergency Department (HOSPITAL_COMMUNITY)
Admission: EM | Admit: 2020-06-25 | Discharge: 2020-06-25 | Disposition: A | Discharge status: Home / Self Care | Payer: Medicaid Other | Attending: Emergency Medicine | Admitting: Emergency Medicine

## 2020-06-25 DIAGNOSIS — R509 Fever, unspecified: Secondary | ICD-10-CM | POA: Diagnosis not present

## 2020-06-25 DIAGNOSIS — R638 Other symptoms and signs concerning food and fluid intake: Secondary | ICD-10-CM | POA: Insufficient documentation

## 2020-06-25 DIAGNOSIS — E669 Obesity, unspecified: Secondary | ICD-10-CM | POA: Insufficient documentation

## 2020-06-25 DIAGNOSIS — Z20822 Contact with and (suspected) exposure to covid-19: Secondary | ICD-10-CM

## 2020-06-25 DIAGNOSIS — R109 Unspecified abdominal pain: Secondary | ICD-10-CM | POA: Insufficient documentation

## 2020-06-25 DIAGNOSIS — Z87891 Personal history of nicotine dependence: Secondary | ICD-10-CM | POA: Diagnosis not present

## 2020-06-25 DIAGNOSIS — R197 Diarrhea, unspecified: Secondary | ICD-10-CM | POA: Diagnosis not present

## 2020-06-25 DIAGNOSIS — R112 Nausea with vomiting, unspecified: Secondary | ICD-10-CM | POA: Diagnosis not present

## 2020-06-25 DIAGNOSIS — U071 COVID-19: Secondary | ICD-10-CM | POA: Diagnosis not present

## 2020-06-25 DIAGNOSIS — R05 Cough: Secondary | ICD-10-CM | POA: Diagnosis not present

## 2020-06-25 LAB — COMPREHENSIVE METABOLIC PANEL
ALT: 19 U/L (ref 0–44)
AST: 20 U/L (ref 15–41)
Albumin: 4 g/dL (ref 3.5–5.0)
Alkaline Phosphatase: 69 U/L (ref 38–126)
Anion gap: 10 (ref 5–15)
BUN: 7 mg/dL (ref 6–20)
CO2: 24 mmol/L (ref 22–32)
Calcium: 8.9 mg/dL (ref 8.9–10.3)
Chloride: 102 mmol/L (ref 98–111)
Creatinine, Ser: 0.76 mg/dL (ref 0.44–1.00)
GFR calc Af Amer: >60 mL/min (ref >60)
GFR calc non Af Amer: >60 mL/min (ref >60)
Glucose, Bld: 85 mg/dL (ref 70–99)
Potassium: 4 mmol/L (ref 3.5–5.1)
Sodium: 136 mmol/L (ref 135–145)
Total Bilirubin: 0.7 mg/dL (ref 0.3–1.2)
Total Protein: 7.7 g/dL (ref 6.5–8.1)

## 2020-06-25 LAB — I-STAT BETA HCG BLOOD, ED (MC, WL, AP ONLY): I-stat hCG, quantitative: <5 m[IU]/mL (ref <5)

## 2020-06-25 LAB — URINALYSIS, ROUTINE W REFLEX MICROSCOPIC
Bilirubin Urine: NEGATIVE
Glucose, UA: NEGATIVE mg/dL
Hgb urine dipstick: NEGATIVE
Ketones, ur: NEGATIVE mg/dL
Leukocytes,Ua: NEGATIVE
Nitrite: NEGATIVE
Protein, ur: NEGATIVE mg/dL
Specific Gravity, Urine: 1.008 (ref 1.005–1.030)
pH: 6 (ref 5.0–8.0)

## 2020-06-25 LAB — CBC
HCT: 41.7 % (ref 36.0–46.0)
Hemoglobin: 13.1 g/dL (ref 12.0–15.0)
MCH: 26.1 pg (ref 26.0–34.0)
MCHC: 31.4 g/dL (ref 30.0–36.0)
MCV: 83.2 fL (ref 80.0–100.0)
Platelets: 263 10*3/uL (ref 150–400)
RBC: 5.01 MIL/uL (ref 3.87–5.11)
RDW: 14.8 % (ref 11.5–15.5)
WBC: 8.9 10*3/uL (ref 4.0–10.5)
nRBC: 0 % (ref 0.0–0.2)

## 2020-06-25 LAB — LIPASE, BLOOD: Lipase: 22 U/L (ref 11–51)

## 2020-06-25 LAB — LACTIC ACID, PLASMA: Lactic Acid, Venous: 1.3 mmol/L (ref 0.5–1.9)

## 2020-06-25 LAB — SARS CORONAVIRUS 2 BY RT PCR (HOSPITAL ORDER, PERFORMED IN ~~LOC~~ HOSPITAL LAB): SARS Coronavirus 2: POSITIVE — AB

## 2020-06-25 MED ORDER — ACETAMINOPHEN 325 MG PO TABS
650.0000 mg | ORAL_TABLET | Freq: Once | ORAL | Status: AC
  Administered 2020-06-25: 650 mg via ORAL
  Filled 2020-06-25: qty 2

## 2020-06-25 MED ORDER — SODIUM CHLORIDE 0.9% FLUSH: 3.0000 mL | Freq: Once | INTRAVENOUS | Status: DC

## 2020-06-25 MED ORDER — ONDANSETRON HCL 4 MG PO TABS
4.0000 mg | ORAL_TABLET | Freq: Three times a day (TID) | ORAL | 0 refills | Status: DC | PRN
Start: 2020-06-25 — End: 2023-06-16

## 2020-06-25 NOTE — ED Provider Notes (Signed)
[facility] EMERGENCY DEPARTMENT Provider Note   CSN: [id number] Arrival date & time: 06/25/20  1318     History Chief Complaint  Patient presents with   Abdominal Pain    Toni Maldonado is a 39 y.o. female.  HPI 39 year old female with no significant medical history presents to the ER with 1 day history of generalized weakness, tactile fever, nausea, one episode of nonbloody nonbilious vomiting, and one episode of nonbloody diarrhea earlier this morning.  She states that the abdominal pain is more so from the nausea, nonfocal.  Patient stated that she had a dry cough for the last week, had woke up this morning with symptoms described above.  She is not vaccinated.  She has no known Covid exposures.  Denies any chest pain or shortness of breath.  No headache, weakness, dizziness.  Denies any recent drug or alcohol use.  She states that she has not been sexually active in the last 2 months.  Denies any lower abdominal pain, vaginal discharge, bleeding, odors. No loss of taste or smell.     No past medical history on file.  There are no problems to display for this patient.   Past Surgical History:  Procedure Laterality Date   CHOLECYSTECTOMY       OB History    Gravida  1   Para      Term      Preterm      AB      Living        SAB      TAB      Ectopic      Multiple      Live Births              No family history on file.  Social History   Tobacco Use   Smoking status: Former Smoker   Smokeless tobacco: Never Used  Substance Use Topics   Alcohol use: Yes   Drug use: No    Home Medications Prior to Admission medications   Medication Sig Start Date End Date Taking? Authorizing Provider  cephALEXin (KEFLEX) 500 MG capsule Take 1 capsule (500 mg total) by mouth 4 (four) times daily. Patient not taking: Reported on 01/23/2016 01/19/15   Geoffery Lyons, MD  HYDROcodone-acetaminophen [facility]) 5-325 MG per tablet Take 1-2 tablets  by mouth every 6 (six) hours as needed. Patient not taking: Reported on 01/23/2016 01/19/15   Geoffery Lyons, MD  naproxen (NAPROSYN) 500 MG tablet Take 1 tablet (500 mg total) by mouth 2 (two) times daily. Patient not taking: Reported on 01/23/2016 05/29/14   Santiago Glad, PA-C  promethazine (PHENERGAN) 25 MG tablet Take 1 tablet (25 mg total) by mouth every 6 (six) hours as needed for nausea or vomiting. 09/14/17   Georgetta Haber, NP    Allergies    Patient has no known allergies.  Review of Systems   Review of Systems  Constitutional: Positive for appetite change, chills and fever.  HENT: Negative for ear pain and sore throat.   Eyes: Negative for pain and visual disturbance.  Respiratory: Negative for cough and shortness of breath.   Cardiovascular: Negative for chest pain and palpitations.  Gastrointestinal: Positive for abdominal pain, diarrhea and nausea. Negative for vomiting.  Genitourinary: Negative for dysuria and hematuria.  Musculoskeletal: Negative for arthralgias and back pain.  Skin: Negative for color change and rash.  Neurological: Negative for dizziness, seizures, syncope and headaches.  Psychiatric/Behavioral: Negative for confusion.  All  other systems reviewed and are negative.   Physical Exam Updated Vital Signs BP 112/66 (BP Location: Left Arm)    Pulse 86    Temp 99.6 F (37.6 C) (Oral)    Resp 18    Ht 5\' 2"  (1.575 m)    Wt (!) 104.3 kg    SpO2 99%    BMI 42.06 kg/m   Physical Exam Vitals and nursing note reviewed.  Constitutional:      General: She is not in acute distress.    Appearance: She is well-developed. She is obese. She is not ill-appearing, toxic-appearing or diaphoretic.  HENT:     Head: Normocephalic and atraumatic.  Eyes:     Conjunctiva/sclera: Conjunctivae normal.  Cardiovascular:     Rate and Rhythm: Normal rate and regular rhythm.     Heart sounds: Normal heart sounds. No murmur heard.   Pulmonary:     Effort: Pulmonary effort  is normal. No respiratory distress.     Breath sounds: Normal breath sounds.  Abdominal:     General: Abdomen is flat. Bowel sounds are normal.     Palpations: Abdomen is soft.     Tenderness: There is no abdominal tenderness.  Musculoskeletal:     Cervical back: Neck supple.  Skin:    General: Skin is warm and dry.  Neurological:     Mental Status: She is alert.     ED Results / Procedures / Treatments   Labs (all labs ordered are listed, but only abnormal results are displayed) Labs Reviewed  CULTURE, BLOOD (ROUTINE X 2)  CULTURE, BLOOD (ROUTINE X 2)  SARS CORONAVIRUS 2 BY RT PCR (HOSPITAL ORDER, PERFORMED IN [facility])  LIPASE, BLOOD  COMPREHENSIVE METABOLIC PANEL  CBC  LACTIC ACID, PLASMA  URINALYSIS, ROUTINE W REFLEX MICROSCOPIC  LACTIC ACID, PLASMA  I-STAT BETA HCG BLOOD, ED ([facility], [facility], [facility] ONLY)    EKG None  Radiology No results found.  Procedures Procedures (including critical care time)  Medications Ordered in ED Medications  sodium chloride flush (NS) 0.9 % injection 3 mL (has no administration in time range)  acetaminophen (TYLENOL) tablet 650 mg (650 mg Oral Given 06/25/20 1435)    ED Course  I have reviewed the triage vital signs and the nursing notes.  Pertinent labs & imaging results that were available during my care of the patient were reviewed by me and considered in my medical decision making (see chart for details).    MDM Rules/Calculators/A&P                         39 year old female with 1 day history of nausea, 1 episode of vomiting, diarrhea, generalized body aches, fevers, not vaccinated for Covid On presentation, she is alert, oriented, nontoxic-appearing, no acute distress.  She is borderline temperature of 99.6, however other vitals are reassuring.  She is not tachycardic, tachypneic or hypoxic.  Physical exam with soft and nontender abdomen.  Lung sounds clear.  Do not think she needs a pelvic examination as she has no  vaginal complaints.  Personally reviewed and interpreted her lab work, CMP with no electrolyte abnormalities, normal renal function and liver function tests.  Lactic normal.  Lipase normal.  CBC without leukocytosis.  UA pending.  Will add on Covid swab and chest x-ray given cough.  Given abdomen is soft and nontender, do not think she needs CT imaging.  Suspect patient will be stable for discharge.  8:45 PM: UA  without evidence of UTI.  Covid pending.  Patient educated on 14 until her results, and requiring an additional 7 to 10 days of quarantine if her results are positive.  Strict return precautions discussed.  All of her questions have been answered to her satisfaction, she voices understanding is agreeable to this plan.  At this stage in the ED course, the patient is medically screened and stable for discharge.  Final Clinical Impression(s) / ED Diagnoses Final diagnoses:  None    Rx / DC Orders ED Discharge Orders    None       Leone Brand 06/25/20 2050    Sabino Donovan, MD 06/25/20 2113

## 2020-06-25 NOTE — ED Triage Notes (Signed)
Pt c/o fever, abd pain, nausea and diarrhea that started this morning, denies any Covid expure, pt is not vaccinated.

## 2020-06-25 NOTE — ED Notes (Signed)
Patient already discharged. Received call from [facility] COVID+. Called patient to inform her of results.

## 2020-06-25 NOTE — Discharge Instructions (Addendum)
You were tested for COVID-19 and.  Please make sure to quarantine until you receive your results.  You may find these on MyChart.  If your tests are positive, you will receive a call and you will need to quarantine for an additional 7 to 10 days.  Please make sure to drink plenty of fluids.  I have sent a prescription for some nausea medication called Zofran.  Please take this as needed.  Please take Tylenol or ibuprofen for pain and fever.  Return to the ER if your symptoms worsen.     Person Under Monitoring Name: Toni Maldonado  Location: [address] [place] [address]   Infection Prevention Recommendations for Individuals Confirmed to have, or Being Evaluated for, 2019 Novel Coronavirus (COVID-19) Infection Who Receive Care at Home  Individuals who are confirmed to have, or are being evaluated for, COVID-19 should follow the prevention steps below until a healthcare provider or local or state health department says they can return to normal activities.  Stay home except to get medical care You should restrict activities outside your home, except for getting medical care. Do not go to work, school, or public areas, and do not use public transportation or taxis.  Call ahead before visiting your doctor Before your medical appointment, call the healthcare provider and tell them that you have, or are being evaluated for, COVID-19 infection. This will help the healthcare provider's office take steps to keep other people from getting infected. Ask your healthcare provider to call the local or state health department.  Monitor your symptoms Seek prompt medical attention if your illness is worsening (e.g., difficulty breathing). Before going to your medical appointment, call the healthcare provider and tell them that you have, or are being evaluated for, COVID-19 infection. Ask your healthcare provider to call the local or state health department.  Wear a facemask You should  wear a facemask that covers your nose and mouth when you are in the same room with other people and when you visit a healthcare provider. People who live with or visit you should also wear a facemask while they are in the same room with you.  Separate yourself from other people in your home As much as possible, you should stay in a different room from other people in your home. Also, you should use a separate bathroom, if available.  Avoid sharing household items You should not share dishes, drinking glasses, cups, eating utensils, towels, bedding, or other items with other people in your home. After using these items, you should wash them thoroughly with soap and water.  Cover your coughs and sneezes Cover your mouth and nose with a tissue when you cough or sneeze, or you can cough or sneeze into your sleeve. Throw used tissues in a lined trash can, and immediately wash your hands with soap and water for at least 20 seconds or use an alcohol-based hand rub.  Wash your [employer] your hands often and thoroughly with soap and water for at least 20 seconds. You can use an alcohol-based hand sanitizer if soap and water are not available and if your hands are not visibly dirty. Avoid touching your eyes, nose, and mouth with unwashed hands.   Prevention Steps for Caregivers and Household Members of Individuals Confirmed to have, or Being Evaluated for, COVID-19 Infection Being Cared for in the Home  If you live with, or provide care at home for, a person confirmed to have, or being evaluated for, COVID-19  infection please follow these guidelines to prevent infection:  Follow healthcare provider's instructions Make sure that you understand and can help the patient follow any healthcare provider instructions for all care.  Provide for the patient's basic needs You should help the patient with basic needs in the home and provide support for getting groceries, prescriptions, and other  personal needs.  Monitor the patient's symptoms If they are getting sicker, call his or her medical provider and tell them that the patient has, or is being evaluated for, COVID-19 infection. This will help the healthcare provider's office take steps to keep other people from getting infected. Ask the healthcare provider to call the local or state health department.  Limit the number of people who have contact with the patient If possible, have only one caregiver for the patient. Other household members should stay in another home or place of residence. If this is not possible, they should stay in another room, or be separated from the patient as much as possible. Use a separate bathroom, if available. Restrict visitors who do not have an essential need to be in the home.  Keep older adults, very young children, and other sick people away from the patient Keep older adults, very young children, and those who have compromised immune systems or chronic health conditions away from the patient. This includes people with chronic heart, lung, or kidney conditions, diabetes, and cancer.  Ensure good ventilation Make sure that shared spaces in the home have good air flow, such as from an air conditioner or an opened window, weather permitting.  Wash your hands often Wash your hands often and thoroughly with soap and water for at least 20 seconds. You can use an alcohol based hand sanitizer if soap and water are not available and if your hands are not visibly dirty. Avoid touching your eyes, nose, and mouth with unwashed hands. Use disposable paper towels to dry your hands. If not available, use dedicated cloth towels and replace them when they become wet.  Wear a facemask and gloves Wear a disposable facemask at all times in the room and gloves when you touch or have contact with the patient's blood, body fluids, and/or secretions or excretions, such as sweat, saliva, sputum, nasal mucus, vomit,  urine, or feces.  Ensure the mask fits over your nose and mouth tightly, and do not touch it during use. Throw out disposable facemasks and gloves after using them. Do not reuse. Wash your hands immediately after removing your facemask and gloves. If your personal clothing becomes contaminated, carefully remove clothing and launder. Wash your hands after handling contaminated clothing. Place all used disposable facemasks, gloves, and other waste in a lined container before disposing them with other household waste. Remove gloves and wash your hands immediately after handling these items.  Do not share dishes, glasses, or other household items with the patient Avoid sharing household items. You should not share dishes, drinking glasses, cups, eating utensils, towels, bedding, or other items with a patient who is confirmed to have, or being evaluated for, COVID-19 infection. After the person uses these items, you should wash them thoroughly with soap and water.  Wash laundry thoroughly Immediately remove and wash clothes or bedding that have blood, body fluids, and/or secretions or excretions, such as sweat, saliva, sputum, nasal mucus, vomit, urine, or feces, on them. Wear gloves when handling laundry from the patient. Read and follow directions on labels of laundry or clothing items and detergent. In general, wash and dry  with the warmest temperatures recommended on the label.  Clean all areas the individual has used often Clean all touchable surfaces, such as counters, tabletops, doorknobs, bathroom fixtures, toilets, phones, keyboards, tablets, and bedside tables, every day. Also, clean any surfaces that may have blood, body fluids, and/or secretions or excretions on them. Wear gloves when cleaning surfaces the patient has come in contact with. Use a diluted bleach solution (e.g., dilute bleach with 1 part bleach and 10 parts water) or a household disinfectant with a label that says  EPA-registered for coronaviruses. To make a bleach solution at home, add 1 tablespoon of bleach to 1 quart (4 cups) of water. For a larger supply, add  cup of bleach to 1 gallon (16 cups) of water. Read labels of cleaning products and follow recommendations provided on product labels. Labels contain instructions for safe and effective use of the cleaning product including precautions you should take when applying the product, such as wearing gloves or eye protection and making sure you have good ventilation during use of the product. Remove gloves and wash hands immediately after cleaning.  Monitor yourself for signs and symptoms of illness Caregivers and household members are considered close contacts, should monitor their health, and will be asked to limit movement outside of the home to the extent possible. Follow the monitoring steps for close contacts listed on the symptom monitoring form.   ? If you have additional questions, contact your local health department or call the epidemiologist on call at [phone] (available 24/7). ? This guidance is subject to change. For the most up-to-date guidance from [facility], please refer to their website: [email or web address]

## 2020-06-26 ENCOUNTER — Telehealth: Payer: Self-pay | Admitting: *Deleted

## 2020-06-26 ENCOUNTER — Other Ambulatory Visit: Payer: Self-pay | Admitting: Adult Health

## 2020-06-26 DIAGNOSIS — U071 COVID-19: Secondary | ICD-10-CM

## 2020-06-26 MED ORDER — SODIUM CHLORIDE 0.9 % IV SOLN
Freq: Once | INTRAVENOUS | Status: AC
  Filled 2020-06-26: qty 600

## 2020-06-26 NOTE — Telephone Encounter (Signed)
Contacted pt to complete transition of care assessment:   Transition Care Management Follow-up Telephone Call   [facility] Managed Care Transition Call Status:MM [facility] Call Made   Date of discharge and from where: [facility], 06/25/20   How have you been since you were released from the hospital? Still has headache   Any questions or concerns? no  Items Reviewed:  Did the pt receive and understand the discharge instructions provided? No   Medications obtained and verified? Yes   Any new allergies since your discharge? No   Dietary orders reviewed? No  Do you have support at home? yes Functional Questionnaire: (I = Independent and D = Dependent)  ADLs: Independent Bathing/Dressing:Independent Meal Prep: Independent Eating: Independent Maintaining continence: Independent Transferring/Ambulation: Independent  Managing Meds: Independent   Follow up appointments reviewed:  PCP Hospital f/u appt confirmed? No pt does have PCP and would like to be assigned one  Specialist Hospital f/u appt confirmed?  Scheduled to see [facility] on 06/27/20 @1030    Are transportation arrangements needed? No   If their condition worsens, is the pt aware to call PCP or go to the EmergencyDept.? Yes  Was the patient provided with contact information for the PCP's office or ED? yes  Was to pt encouraged to call back with questions or concerns? Yes  Pt to be contacted by Internal Medicine for assignment of PCP.  Redge Gainer, RN, BSN, CCRN [facility] [phone]

## 2020-06-26 NOTE — Progress Notes (Signed)
I connected by phone with Toni Maldonado on 06/26/2020 at 7:54 AM to discuss the potential use of a new treatment for mild to moderate COVID-19 viral infection in non-hospitalized patients.  This patient is a 39 y.o. female that meets the FDA criteria for Emergency Use Authorization of COVID monoclonal antibody casirivimab/imdevimab.  Has a (+) direct SARS-CoV-2 viral test result  Has mild or moderate COVID-19   Is NOT hospitalized due to COVID-19  Is within 10 days of symptom onset  Has at least one of the high risk factor(s) for progression to severe COVID-19 and/or hospitalization as defined in EUA.  Specific high risk criteria : BMI > 25   I have spoken and communicated the following to the patient or parent/caregiver regarding COVID monoclonal antibody treatment:  1. FDA has authorized the emergency use for the treatment of mild to moderate COVID-19 in adults and pediatric patients with positive results of direct SARS-CoV-2 viral testing who are 73 years of age and older weighing at least 40 kg, and who are at high risk for progressing to severe COVID-19 and/or hospitalization.  2. The significant known and potential risks and benefits of COVID monoclonal antibody, and the extent to which such potential risks and benefits are unknown.  3. Information on available alternative treatments and the risks and benefits of those alternatives, including clinical trials.  4. Patients treated with COVID monoclonal antibody should continue to self-isolate and use infection control measures (e.g., wear mask, isolate, social distance, avoid sharing personal items, clean and disinfect "high touch" surfaces, and frequent handwashing) according to CDC guidelines.   5. The patient or parent/caregiver has the option to accept or refuse COVID monoclonal antibody treatment.  After reviewing this information with the patient, The patient agreed to proceed with receiving casirivimab\imdevimab infusion and  will be provided a copy of the Fact sheet prior to receiving the infusion. [name] 06/26/2020 7:54 AM

## 2020-06-27 ENCOUNTER — Ambulatory Visit (HOSPITAL_COMMUNITY)
Admission: RE | Admit: 2020-06-27 | Discharge: 2020-06-27 | Disposition: A | Discharge status: Home / Self Care | Payer: Medicaid Other | Source: Ambulatory Visit | Attending: Pulmonary Disease | Admitting: Pulmonary Disease

## 2020-06-27 DIAGNOSIS — U071 COVID-19: Secondary | ICD-10-CM | POA: Diagnosis not present

## 2020-06-27 MED ORDER — ONDANSETRON HCL 4 MG/2ML IJ SOLN: 4.0000 mg | Freq: Once | INTRAMUSCULAR | Status: DC

## 2020-06-27 MED ORDER — FAMOTIDINE IN NACL 20-0.9 MG/50ML-% IV SOLN: 20.0000 mg | Freq: Once | INTRAVENOUS | Status: DC | PRN

## 2020-06-27 MED ORDER — METHYLPREDNISOLONE SODIUM SUCC 125 MG IJ SOLR: 125.0000 mg | Freq: Once | INTRAMUSCULAR | Status: DC | PRN

## 2020-06-27 MED ORDER — ALBUTEROL SULFATE HFA 108 (90 BASE) MCG/ACT IN AERS: 2.0000 | INHALATION_SPRAY | Freq: Once | RESPIRATORY_TRACT | Status: DC | PRN

## 2020-06-27 MED ORDER — DIPHENHYDRAMINE HCL 50 MG/ML IJ SOLN: 50.0000 mg | Freq: Once | INTRAMUSCULAR | Status: DC | PRN

## 2020-06-27 MED ORDER — EPINEPHRINE 0.3 MG/0.3ML IJ SOAJ: 0.3000 mg | Freq: Once | INTRAMUSCULAR | Status: DC | PRN

## 2020-06-27 MED ORDER — SODIUM CHLORIDE 0.9 % IV SOLN: INTRAVENOUS | Status: DC | PRN

## 2020-06-27 NOTE — Progress Notes (Signed)
  Diagnosis: COVID-19  Physician: Dr. Patrick Wright  Procedure: [facility] Med: casirivimab\imdevimab infusion - Provided patient with casirivimab\imdevimab fact sheet for patients, parents and caregivers prior to infusion.  Complications: No immediate complications noted.  Discharge: Discharged home   Shermika Balthaser 06/27/2020   

## 2020-06-27 NOTE — Discharge Instructions (Signed)

## 2020-06-30 LAB — CULTURE, BLOOD (ROUTINE X 2)
Culture: NO GROWTH
Special Requests: ADEQUATE

## 2021-11-28 ENCOUNTER — Telehealth: Payer: Medicaid Other | Admitting: Nurse Practitioner

## 2021-11-28 NOTE — Progress Notes (Signed)
Eviist was for her daughter. cancelled

## 2021-11-28 NOTE — Progress Notes (Signed)
erroneous

## 2022-08-04 DIAGNOSIS — H5213 Myopia, bilateral: Secondary | ICD-10-CM | POA: Diagnosis not present

## 2022-09-24 ENCOUNTER — Encounter (HOSPITAL_COMMUNITY): Payer: Self-pay | Admitting: Emergency Medicine

## 2022-09-24 ENCOUNTER — Ambulatory Visit (HOSPITAL_COMMUNITY)
Admission: EM | Admit: 2022-09-24 | Discharge: 2022-09-24 | Disposition: A | Discharge status: Home / Self Care | Payer: Medicaid Other | Attending: Emergency Medicine | Admitting: Emergency Medicine

## 2022-09-24 ENCOUNTER — Ambulatory Visit (INDEPENDENT_AMBULATORY_CARE_PROVIDER_SITE_OTHER): Payer: Medicaid Other

## 2022-09-24 DIAGNOSIS — S62661A Nondisplaced fracture of distal phalanx of left index finger, initial encounter for closed fracture: Secondary | ICD-10-CM

## 2022-09-24 DIAGNOSIS — M79645 Pain in left finger(s): Secondary | ICD-10-CM

## 2022-09-24 DIAGNOSIS — S6992XA Unspecified injury of left wrist, hand and finger(s), initial encounter: Secondary | ICD-10-CM

## 2022-09-24 NOTE — ED Provider Notes (Signed)
[facility]    CSN: [id number] Arrival date & time: 09/24/22  0177      History   Chief Complaint Chief Complaint  Patient presents with   Finger Injury    HPI Toni Maldonado is a 41 y.o. female.  Presents with left hand injury, index finger Heard a popping sound when putting shoes on last night Pain with pressure on the distal index finger  No numbness/tingling No history of injury  History reviewed. No pertinent past medical history.  There are no problems to display for this patient.   Past Surgical History:  Procedure Laterality Date   CHOLECYSTECTOMY      OB History     Gravida  1   Para      Term      Preterm      AB      Living         SAB      IAB      Ectopic      Multiple      Live Births               Home Medications    Prior to Admission medications   Medication Sig Start Date End Date Taking? Authorizing Provider  cephALEXin (KEFLEX) 500 MG capsule Take 1 capsule (500 mg total) by mouth 4 (four) times daily. Patient not taking: Reported on 01/23/2016 01/19/15   Geoffery Lyons, MD  HYDROcodone-acetaminophen [facility]) 5-325 MG per tablet Take 1-2 tablets by mouth every 6 (six) hours as needed. Patient not taking: Reported on 01/23/2016 01/19/15   Geoffery Lyons, MD  naproxen (NAPROSYN) 500 MG tablet Take 1 tablet (500 mg total) by mouth 2 (two) times daily. Patient not taking: Reported on 01/23/2016 05/29/14   Santiago Glad, PA-C  ondansetron (ZOFRAN) 4 MG tablet Take 1 tablet (4 mg total) by mouth every 8 (eight) hours as needed for nausea or vomiting. 06/25/20   Mare Ferrari, PA-C  promethazine (PHENERGAN) 25 MG tablet Take 1 tablet (25 mg total) by mouth every 6 (six) hours as needed for nausea or vomiting. 09/14/17   Georgetta Haber, NP    Family History History reviewed. No pertinent family history.  Social History Social History   Tobacco Use   Smoking status: Former   Smokeless tobacco: Never  Substance  Use Topics   Alcohol use: Yes   Drug use: No     Allergies   Patient has no known allergies.   Review of Systems Review of Systems Per HPI  Physical Exam Triage Vital Signs ED Triage Vitals  Enc Vitals Group     BP 09/24/22 1109 (!) 137/90     Pulse Rate 09/24/22 1109 62     Resp 09/24/22 1109 18     Temp 09/24/22 1109 98.1 F (36.7 C)     Temp Source 09/24/22 1109 Oral     SpO2 09/24/22 1109 97 %     Weight --      Height --      Head Circumference --      Peak Flow --      Pain Score 09/24/22 1110 5     Pain Loc --      Pain Edu? --      Excl. in GC? --    No data found.  Updated Vital Signs BP (!) 137/90 (BP Location: Right Arm)   Pulse 62   Temp 98.1 F (36.7 C) (Oral)  Resp 18   SpO2 97%   Physical Exam Vitals and nursing note reviewed.  Constitutional:      General: She is not in acute distress. Cardiovascular:     Rate and Rhythm: Normal rate and regular rhythm.  Pulmonary:     Effort: Pulmonary effort is normal.  Musculoskeletal:        General: Tenderness present. No swelling or deformity.     Comments: Tender with DIP motion and palpation. No obvious deformity or swelling  Skin:    General: Skin is warm and dry.     Findings: No bruising.  Neurological:     Mental Status: She is alert and oriented to person, place, and time.     UC Treatments / Results  Labs (all labs ordered are listed, but only abnormal results are displayed) Labs Reviewed - No data to display  EKG  Radiology DG Finger Index Left  Result Date: 09/24/2022 CLINICAL DATA:  Popping sensation EXAM: LEFT INDEX FINGER 2+V COMPARISON:  None Available. FINDINGS: Apparent nondisplaced corner fracture at the base of the distal phalanx second digit. This fractures along the ulnar border and enters articular surface. IMPRESSION: Nondisplaced fracture of the base of the distal phalanx second digit Electronically Signed   By: Suzy Bouchard M.D.   On: 09/24/2022 12:10     Procedures Procedures   Medications Ordered in UC Medications - No data to display  Initial Impression / Assessment and Plan / UC Course  I have reviewed the triage vital signs and the nursing notes.  Pertinent labs & imaging results that were available during my care of the patient were reviewed by me and considered in my medical decision making (see chart for details).  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 Patient agrees to plan  Final Clinical Impressions(s) / UC Diagnoses   Final diagnoses:  Nondisplaced fracture of distal phalanx of left index finger, initial encounter for closed fracture  Injury of finger of left hand, initial encounter     Discharge Instructions      Please wear the splint for protection and to avoid bending the joint.  Follow up with the orthopedic specialists in a week or so as needed.     ED Prescriptions   None    PDMP not reviewed this encounter.   Robi Mitter, Wells Guiles, PA-C 09/24/22 1249

## 2022-09-24 NOTE — Discharge Instructions (Signed)
Please wear the splint for protection and to avoid bending the joint.  Follow up with the orthopedic specialists in a week or so as needed.

## 2022-09-24 NOTE — ED Triage Notes (Signed)
Pt reports left index finger pain. States she was trying to put on shoes and heard her finger pop lastnight. States pain increases when trying to apply pressure.

## 2022-10-01 DIAGNOSIS — S62661A Nondisplaced fracture of distal phalanx of left index finger, initial encounter for closed fracture: Secondary | ICD-10-CM | POA: Diagnosis not present

## 2022-10-15 DIAGNOSIS — S62661D Nondisplaced fracture of distal phalanx of left index finger, subsequent encounter for fracture with routine healing: Secondary | ICD-10-CM | POA: Diagnosis not present

## 2022-11-05 DIAGNOSIS — S62661D Nondisplaced fracture of distal phalanx of left index finger, subsequent encounter for fracture with routine healing: Secondary | ICD-10-CM | POA: Diagnosis not present

## 2023-05-16 ENCOUNTER — Encounter (HOSPITAL_COMMUNITY): Payer: Self-pay | Admitting: Emergency Medicine

## 2023-05-16 ENCOUNTER — Emergency Department (HOSPITAL_COMMUNITY)
Admission: EM | Admit: 2023-05-16 | Discharge: 2023-05-16 | Disposition: A | Discharge status: Home / Self Care | Payer: Medicaid Other | Attending: Emergency Medicine | Admitting: Emergency Medicine

## 2023-05-16 ENCOUNTER — Other Ambulatory Visit: Payer: Self-pay

## 2023-05-16 DIAGNOSIS — S161XXA Strain of muscle, fascia and tendon at neck level, initial encounter: Secondary | ICD-10-CM

## 2023-05-16 DIAGNOSIS — Y92410 Unspecified street and highway as the place of occurrence of the external cause: Secondary | ICD-10-CM | POA: Diagnosis not present

## 2023-05-16 DIAGNOSIS — S199XXA Unspecified injury of neck, initial encounter: Secondary | ICD-10-CM | POA: Diagnosis present

## 2023-05-16 MED ORDER — CYCLOBENZAPRINE HCL 10 MG PO TABS: 10.0000 mg | ORAL_TABLET | Freq: Two times a day (BID) | ORAL | 0 refills | Status: DC | PRN

## 2023-05-16 MED ORDER — METHYLPREDNISOLONE 4 MG PO TBPK: ORAL_TABLET | ORAL | 0 refills | Status: DC

## 2023-05-16 MED ORDER — KETOROLAC TROMETHAMINE 60 MG/2ML IM SOLN
30.0000 mg | Freq: Once | INTRAMUSCULAR | Status: AC
  Administered 2023-05-16: 30 mg via INTRAMUSCULAR
  Filled 2023-05-16: qty 2

## 2023-05-16 NOTE — Discharge Instructions (Signed)
Take Medrol Dosepak as prescribed.  Take Flexeril as prescribed this medication is mildly sedating.  Follow-up with primary care doctor.  Recommend continued use of Tylenol 1000 mg every 6 hours as needed for pain.
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
## 2023-06-16 ENCOUNTER — Ambulatory Visit: Payer: Medicaid Other | Admitting: Family

## 2023-06-16 ENCOUNTER — Encounter: Payer: Self-pay | Admitting: Family

## 2023-06-16 VITALS — BP 120/78 | HR 78 | Temp 97.3°F | Resp 18 | Ht 62.0 in | Wt 253.0 lb

## 2023-06-16 DIAGNOSIS — M25561 Pain in right knee: Secondary | ICD-10-CM

## 2023-06-16 DIAGNOSIS — Z789 Other specified health status: Secondary | ICD-10-CM

## 2023-06-16 DIAGNOSIS — G8929 Other chronic pain: Secondary | ICD-10-CM

## 2023-06-16 DIAGNOSIS — Z1231 Encounter for screening mammogram for malignant neoplasm of breast: Secondary | ICD-10-CM | POA: Diagnosis not present

## 2023-06-16 DIAGNOSIS — Z124 Encounter for screening for malignant neoplasm of cervix: Secondary | ICD-10-CM

## 2023-06-16 DIAGNOSIS — Z87891 Personal history of nicotine dependence: Secondary | ICD-10-CM | POA: Diagnosis not present

## 2023-06-16 DIAGNOSIS — Z23 Encounter for immunization: Secondary | ICD-10-CM | POA: Diagnosis not present

## 2023-06-16 DIAGNOSIS — M25522 Pain in left elbow: Secondary | ICD-10-CM

## 2023-06-16 DIAGNOSIS — Z13220 Encounter for screening for lipoid disorders: Secondary | ICD-10-CM | POA: Diagnosis not present

## 2023-06-16 DIAGNOSIS — Z0000 Encounter for general adult medical examination without abnormal findings: Secondary | ICD-10-CM | POA: Diagnosis not present

## 2023-06-16 DIAGNOSIS — Z1159 Encounter for screening for other viral diseases: Secondary | ICD-10-CM | POA: Diagnosis not present

## 2023-06-16 LAB — CBC WITH DIFFERENTIAL/PLATELET
Absolute Monocytes: 502 cells/uL (ref 200–950)
Basophils Absolute: 38 cells/uL (ref 0–200)
Basophils Relative: 0.5 %
Eosinophils Relative: 1.1 %
Lymphs Abs: 3443 cells/uL (ref 850–3900)
MCH: 24.7 pg — ABNORMAL LOW (ref 27.0–33.0)
MCHC: 31.1 g/dL — ABNORMAL LOW (ref 32.0–36.0)
MCV: 79.4 fL — ABNORMAL LOW (ref 80.0–100.0)
Neutrophils Relative %: 46.5 %
Platelets: 274 10*3/uL (ref 140–400)
RBC: 4.57 10*6/uL (ref 3.80–5.10)
RDW: 15.3 % — ABNORMAL HIGH (ref 11.0–15.0)
WBC: 7.6 10*3/uL (ref 3.8–10.8)

## 2023-06-16 MED ORDER — IBUPROFEN 600 MG PO TABS: 600.0000 mg | ORAL_TABLET | Freq: Three times a day (TID) | ORAL | 0 refills | Status: AC | PRN
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
## 2023-06-17 LAB — COMPLETE METABOLIC PANEL WITH GFR
AG Ratio: 1.2 (calc) (ref 1.0–2.5)
ALT: 15 U/L (ref 6–29)
AST: 27 U/L (ref 10–30)
Albumin: 3.6 g/dL (ref 3.6–5.1)
Alkaline phosphatase (APISO): 59 U/L (ref 31–125)
BUN: 14 mg/dL (ref 7–25)
CO2: 29 mmol/L (ref 20–32)
Calcium: 8.4 mg/dL — ABNORMAL LOW (ref 8.6–10.2)
Chloride: 108 mmol/L (ref 98–110)
Creat: 0.72 mg/dL (ref 0.50–0.99)
Globulin: 3 g/dL (calc) (ref 1.9–3.7)
Glucose, Bld: 90 mg/dL (ref 65–99)
Potassium: 4.9 mmol/L (ref 3.5–5.3)
Sodium: 142 mmol/L (ref 135–146)
Total Bilirubin: 0.3 mg/dL (ref 0.2–1.2)
Total Protein: 6.6 g/dL (ref 6.1–8.1)
eGFR: 107 mL/min/{1.73_m2} (ref >=60)

## 2023-06-17 LAB — TSH: TSH: 3.31 mIU/L

## 2023-06-17 LAB — LIPID PANEL
Cholesterol: 162 mg/dL (ref <200)
HDL: 51 mg/dL (ref >=50)
LDL Cholesterol (Calc): 96 mg/dL (calc)
Non-HDL Cholesterol (Calc): 111 mg/dL (calc) (ref <130)
Total CHOL/HDL Ratio: 3.2 (calc) (ref <5.0)
Triglycerides: 60 mg/dL (ref <150)

## 2023-06-17 LAB — CBC WITH DIFFERENTIAL/PLATELET
Eosinophils Absolute: 84 cells/uL (ref 15–500)
HCT: 36.3 % (ref 35.0–45.0)
Hemoglobin: 11.3 g/dL — ABNORMAL LOW (ref 11.7–15.5)
MPV: 10.4 fL (ref 7.5–12.5)
Monocytes Relative: 6.6 %
Neutro Abs: 3534 cells/uL (ref 1500–7800)
Total Lymphocyte: 45.3 %

## 2023-07-03 ENCOUNTER — Other Ambulatory Visit: Payer: Self-pay

## 2023-08-01 ENCOUNTER — Ambulatory Visit (HOSPITAL_COMMUNITY)
Admission: RE | Admit: 2023-08-01 | Discharge: 2023-08-01 | Disposition: A | Discharge status: Home / Self Care | Payer: Medicaid Other | Source: Ambulatory Visit | Attending: Family | Admitting: Family

## 2023-08-01 ENCOUNTER — Encounter (HOSPITAL_COMMUNITY): Payer: Self-pay

## 2023-08-01 VITALS — BP 119/76 | HR 84 | Temp 98.8°F | Resp 17

## 2023-08-01 DIAGNOSIS — M7712 Lateral epicondylitis, left elbow: Secondary | ICD-10-CM

## 2023-08-01 NOTE — Discharge Instructions (Signed)
Recommend Ice, rest, Ibuprofen as needed Can wear small compression sleeve Recommend doing the stretching exercises at least once daily over the next few weeks.  If no improvement follow up with [facility]
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
## 2023-08-04 ENCOUNTER — Ambulatory Visit: Payer: Medicaid Other

## 2023-08-13 ENCOUNTER — Encounter: Payer: Medicaid Other | Admitting: Radiology

## 2023-08-21 ENCOUNTER — Telehealth: Payer: Self-pay

## 2023-08-21 ENCOUNTER — Encounter: Payer: Self-pay | Admitting: Obstetrics and Gynecology

## 2023-08-21 ENCOUNTER — Ambulatory Visit (INDEPENDENT_AMBULATORY_CARE_PROVIDER_SITE_OTHER): Payer: Medicaid Other | Admitting: Obstetrics and Gynecology

## 2023-08-21 ENCOUNTER — Other Ambulatory Visit (HOSPITAL_COMMUNITY)
Admission: RE | Admit: 2023-08-21 | Discharge: 2023-08-21 | Disposition: A | Discharge status: Home / Self Care | Payer: Medicaid Other | Source: Ambulatory Visit | Attending: Obstetrics and Gynecology | Admitting: Obstetrics and Gynecology

## 2023-08-21 VITALS — BP 128/76 | Ht 63.0 in | Wt 247.0 lb

## 2023-08-21 DIAGNOSIS — N939 Abnormal uterine and vaginal bleeding, unspecified: Secondary | ICD-10-CM | POA: Diagnosis not present

## 2023-08-21 DIAGNOSIS — D649 Anemia, unspecified: Secondary | ICD-10-CM | POA: Insufficient documentation

## 2023-08-21 DIAGNOSIS — Z124 Encounter for screening for malignant neoplasm of cervix: Secondary | ICD-10-CM | POA: Diagnosis not present

## 2023-08-21 DIAGNOSIS — Z113 Encounter for screening for infections with a predominantly sexual mode of transmission: Secondary | ICD-10-CM

## 2023-08-21 DIAGNOSIS — Z01419 Encounter for gynecological examination (general) (routine) without abnormal findings: Secondary | ICD-10-CM | POA: Diagnosis not present

## 2023-08-21 DIAGNOSIS — L732 Hidradenitis suppurativa: Secondary | ICD-10-CM | POA: Diagnosis not present

## 2023-08-21 MED ORDER — NAPROXEN 500 MG PO TABS
ORAL_TABLET | ORAL | 3 refills | Status: AC
Start: 2023-08-21 — End: ?

## 2023-08-21 NOTE — Progress Notes (Signed)
42 y.o. [id number] female with HS, obesity here for annual exam.   Previous GYN care with [facility]. She reports premenstrual syndrome with bloating and HVB. Patient's last menstrual period was 08/13/2023 (approximate). Period Cycle (Days): 28 Period Duration (Days): 5 Period Pattern: Regular Menstrual Flow: Heavy Menstrual Control: Maxi pad, Tampon Dysmenorrhea: (!) Moderate Dysmenorrhea Symptoms: Cramping Notes 3/5 days with HVB, bleeding through 1 tampon an hour and using overnight pads Has HS and notes "boils" prior to cycle, generally resolve with cycle. Notes HS was worse as a teenager.  Pelvic discharge: none Pelvic pain: none Birth control: none Last mammogram: never, plans to schedule Last colonoscopy: NA Sexually active: yes, Relationship for 17y Former smoker, 2y of cig use  OB History  Gravida Para Term Preterm AB Living  4 3 3   1 2   SAB IAB Ectopic Multiple Live Births  1       3    # Outcome Date GA Lbr Len/2nd Weight Sex Type Anes PTL Lv  4 SAB           3 Term      Vag-Spont     2 Term      Vag-Spont     1 Term      Vag-Spont   DEC    History reviewed. No pertinent past medical history.  Past Surgical History:  Procedure Laterality Date   CHOLECYSTECTOMY     2008    Current Outpatient Medications on File Prior to Visit  Medication Sig Dispense Refill   ibuprofen (ADVIL) 600 MG tablet Take 1 tablet (600 mg total) by mouth every 8 (eight) hours as needed for moderate pain. 30 tablet 0   Multiple Vitamin (MULTIVITAMIN PO) Take 1 tablet by mouth daily.     No current facility-administered medications on file prior to visit.    Social History   Socioeconomic History   Marital status: Single    Spouse name: Not on file   Number of children: Not on file   Years of education: Not on file   Highest education level: Not on file  Occupational History   Not on file  Tobacco Use   Smoking status: Former    Types: Cigarettes    Passive  exposure: Never   Smokeless tobacco: Never  Substance and Sexual Activity   Alcohol use: Yes    Comment: socially   Drug use: No   Sexual activity: Yes    Partners: Male    Birth control/protection: Condom  Other Topics Concern   Not on file  Social History Narrative   Not on file   Social Determinants of Health   Financial Resource Strain: Not on file  Food Insecurity: Not on file  Transportation Needs: Not on file  Physical Activity: Not on file  Stress: Not on file  Social Connections: Not on file  Intimate Partner Violence: Not on file    Family History  Problem Relation Age of Onset   Kidney failure Mother    Hypertension Mother    Diabetes Mother    Diabetes Father    Pancreatic cancer Father     No Known Allergies    PE Blood pressure 128/76, height 5\' 3"  (1.6 m), weight 247 lb (112 kg), last menstrual period 08/13/2023. General appearance: alert, cooperative and appears stated age Head: Normocephalic, without obvious abnormality, atraumatic Neck: no adenopathy, supple, symmetrical, thyroid normal to inspection and palpation Lungs: normal respiratory effort Breasts: normal appearance, no masses or  tenderness Abdomen: soft, non-tender; no masses,  no organomegaly Pelvic: External genitalia:  no lesions              Urethra:  normal appearing urethra with no masses, tenderness or lesions              Bartholins and Skenes: normal                 Vagina: normal appearing vagina with normal color and discharge, no lesions.               Cervix: no lesions, no cervical motion tenderness               Bimanual Exam:  Uterus:  normal size, contour, position, consistency, mobility, non-tender              Adnexa: no mass, fullness, tenderness Extremities: extremities normal, atraumatic, no cyanosis or edema Skin: Skin color and texture normal. No rashes or lesions Lymph nodes: Axillary and inguinal nodes normal. Neurologic: Grossly normal         Chaperone was  present for exam.   Assessment and Plan:       1. Abnormal uterine bleeding (AUB) CBC reviewed, mild anemia Prolactin ordered Start naproxen 500mg  BID x5d per cycle Will order TVUS RTO after [place] is completed in 4-6wk  2. Screen for STD (sexually transmitted disease) - HIV antibody (with reflex) - Hepatitis B surface antigen - Hepatitis C Antibody - RPR  3. Encounter for well woman exam with routine gynecological exam Pap smear collected Encouraged annual mammogram screening Colonoscopy not indicated Labs and immunizations with her primary Discussed breast self exams  4. Cervical cancer screening - Cytology - PAP( [place])   5. Anemia Encouraged PO Fe               Nivek Powley V Maalik Pinn

## 2023-08-21 NOTE — Telephone Encounter (Signed)
Rosalyn Gess, MD  [facility] Triage Please schedule TVUS. Patient will need follow-up with me 2 weeks after [place].  [place] order placed. Will leave encounter open to f/u on imaging appt to schedule f/u OV.

## 2023-08-21 NOTE — Patient Instructions (Signed)

## 2023-08-22 NOTE — Telephone Encounter (Signed)
Per review of EPIC, patient is scheduled for PUS on 08/25/23.

## 2023-08-22 NOTE — Telephone Encounter (Signed)
OV scheduled with Dr. Kennith Center on 10/17 at 1430.   Routing to provider for final review. Patient is agreeable to disposition. Will close encounter.

## 2023-08-23 LAB — RPR: RPR Ser Ql: NONREACTIVE

## 2023-08-23 LAB — HIV ANTIBODY (ROUTINE TESTING W REFLEX): HIV 1&2 Ab, 4th Generation: NONREACTIVE

## 2023-08-23 LAB — HEPATITIS C ANTIBODY: Hepatitis C Ab: NONREACTIVE

## 2023-08-23 LAB — PROLACTIN: Prolactin: 7.6 ng/mL

## 2023-08-23 LAB — HEPATITIS B SURFACE ANTIGEN: Hepatitis B Surface Ag: NONREACTIVE

## 2023-08-25 ENCOUNTER — Other Ambulatory Visit: Payer: Medicaid Other

## 2023-08-28 LAB — CYTOLOGY - PAP
Chlamydia: NEGATIVE
Comment: NEGATIVE
Comment: NEGATIVE
Comment: NORMAL
Diagnosis: NEGATIVE
Neisseria Gonorrhea: NEGATIVE
Trichomonas: NEGATIVE

## 2023-09-03 ENCOUNTER — Ambulatory Visit
Admission: RE | Admit: 2023-09-03 | Discharge: 2023-09-03 | Disposition: A | Discharge status: Home / Self Care | Payer: Medicaid Other | Source: Ambulatory Visit | Attending: Obstetrics and Gynecology | Admitting: Obstetrics and Gynecology

## 2023-09-03 DIAGNOSIS — N939 Abnormal uterine and vaginal bleeding, unspecified: Secondary | ICD-10-CM | POA: Diagnosis not present

## 2023-09-11 ENCOUNTER — Ambulatory Visit: Payer: Medicaid Other | Admitting: Obstetrics and Gynecology

## 2023-09-11 ENCOUNTER — Encounter: Payer: Self-pay | Admitting: Obstetrics and Gynecology

## 2023-09-11 VITALS — BP 128/84 | HR 97 | Ht 64.0 in | Wt 254.0 lb

## 2023-09-11 DIAGNOSIS — N939 Abnormal uterine and vaginal bleeding, unspecified: Secondary | ICD-10-CM

## 2023-09-11 DIAGNOSIS — D649 Anemia, unspecified: Secondary | ICD-10-CM

## 2023-09-11 NOTE — Progress Notes (Signed)
42 y.o. [id number] female with HS, obesity, AUB here for follow-up.   Patient's last menstrual period was 09/10/2023 (approximate). Period Cycle (Days): 28 Period Duration (Days): 4-5 Period Pattern: Regular Menstrual Flow: Moderate Menstrual Control: Maxi pad, Tampon Dysmenorrhea: (!) Moderate  At last appt, she noted: "She reports premenstrual syndrome with bloating and HVB. Notes 3/5 days with HVB, bleeding through 1 tampon an hour and using overnight pads Has HS and notes "boils" prior to cycle, generally resolve with cycle. Notes HS was worse as a teenager." CD2-4 HVB days, has not started naproxen yet Sexually active: yes, Relationship for 17y Birth control: none Former smoker, 2y of cig use  GYN Hx: HS  OB History  Gravida Para Term Preterm AB Living  4 3 3   1 2   SAB IAB Ectopic Multiple Live Births  1       3    # Outcome Date GA Lbr Len/2nd Weight Sex Type Anes PTL Lv  4 SAB           3 Term      Vag-Spont     2 Term      Vag-Spont     1 Term      Vag-Spont   DEC    History reviewed. No pertinent past medical history.  Past Surgical History:  Procedure Laterality Date   CHOLECYSTECTOMY     2008    Current Outpatient Medications on File Prior to Visit  Medication Sig Dispense Refill   ibuprofen (ADVIL) 600 MG tablet Take 1 tablet (600 mg total) by mouth every 8 (eight) hours as needed for moderate pain. 30 tablet 0   Multiple Vitamin (MULTIVITAMIN PO) Take 1 tablet by mouth daily.     naproxen (NAPROSYN) 500 MG tablet Take 1 tablet twice a day by mouth for 5 days. Start 2 days before your cycle. 30 tablet 3   No current facility-administered medications on file prior to visit.    Social History   Socioeconomic History   Marital status: Single    Spouse name: Not on file   Number of children: Not on file   Years of education: Not on file   Highest education level: Not on file  Occupational History   Not on file  Tobacco Use   Smoking status:  Former    Types: Cigarettes    Passive exposure: Never   Smokeless tobacco: Never  Substance and Sexual Activity   Alcohol use: Yes    Comment: socially   Drug use: No   Sexual activity: Yes    Partners: Male    Birth control/protection: Condom  Other Topics Concern   Not on file  Social History Narrative   Not on file   Social Determinants of Health   Financial Resource Strain: Not on file  Food Insecurity: Not on file  Transportation Needs: Not on file  Physical Activity: Not on file  Stress: Not on file  Social Connections: Not on file  Intimate Partner Violence: Not on file    Family History  Problem Relation Age of Onset   Kidney failure Mother    Hypertension Mother    Diabetes Mother    Diabetes Father    Pancreatic cancer Father     No Known Allergies    PE Physical Exam Vitals reviewed.  Constitutional:      General: She is not in acute distress.    Appearance: Normal appearance.  HENT:     Head: Normocephalic  and atraumatic.     Nose: Nose normal.  Eyes:     Extraocular Movements: Extraocular movements intact.     Conjunctiva/sclera: Conjunctivae normal.  Pulmonary:     Effort: Pulmonary effort is normal.  Musculoskeletal:        General: Normal range of motion.     Cervical back: Normal range of motion.  Neurological:     General: No focal deficit present.     Mental Status: She is alert.  Psychiatric:        Mood and Affect: Mood normal.        Behavior: Behavior normal.      Assessment and Plan:        Abnormal uterine bleeding (AUB) Assessment & Plan: CBC reviewed, mild anemia TSH, prolactin wnl TVUS: 09/03/23 normal Wants to try conservative therapies Start naproxen 500mg  BID x5d per cycle HVB precautions reviewed RTO for yearly or as needed   Anemia, unspecified type Assessment & Plan: Continue multivitamin with Fe    Toni Maldonado Toni Maldonado

## 2023-09-11 NOTE — Assessment & Plan Note (Addendum)
CBC reviewed, mild anemia TSH, prolactin wnl TVUS: 09/03/23 normal Wants to try conservative therapies Start naproxen 500mg  BID x5d per cycle HVB precautions reviewed RTO for yearly or as needed

## 2023-09-11 NOTE — Assessment & Plan Note (Addendum)
Continue multivitamin with Fe

## 2024-06-17 ENCOUNTER — Encounter: Payer: Medicaid Other | Admitting: Family

## 2024-06-17 NOTE — Progress Notes (Signed)
   This encounter was created in error - please disregard. No show
